# Patient Record
Sex: Male | Born: 1960 | Race: White | Hispanic: No | Marital: Married | Smoking: Former smoker
Health system: Southern US, Community
[De-identification: ages and names within clinical notes are randomized; demographics above are authoritative.]

## PROBLEM LIST (undated history)

## (undated) DIAGNOSIS — Z8249 Family history of ischemic heart disease and other diseases of the circulatory system: Secondary | ICD-10-CM

## (undated) DIAGNOSIS — K219 Gastro-esophageal reflux disease without esophagitis: Secondary | ICD-10-CM

## (undated) DIAGNOSIS — R002 Palpitations: Secondary | ICD-10-CM

## (undated) DIAGNOSIS — M1990 Unspecified osteoarthritis, unspecified site: Secondary | ICD-10-CM

## (undated) HISTORY — DX: Palpitations: R00.2

## (undated) HISTORY — DX: Family history of ischemic heart disease and other diseases of the circulatory system: Z82.49

---

## 2015-04-21 ENCOUNTER — Emergency Department (HOSPITAL_COMMUNITY): Payer: PRIVATE HEALTH INSURANCE

## 2015-04-21 ENCOUNTER — Encounter (HOSPITAL_COMMUNITY): Payer: Self-pay

## 2015-04-21 ENCOUNTER — Emergency Department (HOSPITAL_COMMUNITY)
Admission: EM | Admit: 2015-04-21 | Discharge: 2015-04-21 | Disposition: A | Discharge status: Home / Self Care | Payer: PRIVATE HEALTH INSURANCE | Attending: Emergency Medicine | Admitting: Emergency Medicine

## 2015-04-21 DIAGNOSIS — R0602 Shortness of breath: Secondary | ICD-10-CM | POA: Diagnosis not present

## 2015-04-21 DIAGNOSIS — Z7982 Long term (current) use of aspirin: Secondary | ICD-10-CM | POA: Insufficient documentation

## 2015-04-21 DIAGNOSIS — R079 Chest pain, unspecified: Secondary | ICD-10-CM

## 2015-04-21 DIAGNOSIS — Z79899 Other long term (current) drug therapy: Secondary | ICD-10-CM | POA: Insufficient documentation

## 2015-04-21 LAB — CBC WITH DIFFERENTIAL/PLATELET
BASOS PCT: 0 %
Basophils Absolute: 0 10*3/uL (ref 0.0–0.1)
EOS PCT: 1 %
Eosinophils Absolute: 0.1 10*3/uL (ref 0.0–0.7)
HCT: 44.6 % (ref 39.0–52.0)
HEMOGLOBIN: 14.9 g/dL (ref 13.0–17.0)
Lymphocytes Relative: 18 %
Lymphs Abs: 1.4 10*3/uL (ref 0.7–4.0)
MCH: 28.3 pg (ref 26.0–34.0)
MCHC: 33.4 g/dL (ref 30.0–36.0)
MCV: 84.6 fL (ref 78.0–100.0)
MONO ABS: 0.8 10*3/uL (ref 0.1–1.0)
MONOS PCT: 10 %
NEUTROS ABS: 5.4 10*3/uL (ref 1.7–7.7)
Neutrophils Relative %: 71 %
PLATELETS: 195 10*3/uL (ref 150–400)
RBC: 5.27 MIL/uL (ref 4.22–5.81)
RDW: 13.4 % (ref 11.5–15.5)
WBC: 7.7 10*3/uL (ref 4.0–10.5)

## 2015-04-21 LAB — BASIC METABOLIC PANEL
Anion gap: 7 (ref 5–15)
BUN: 11 mg/dL (ref 6–20)
CALCIUM: 8.9 mg/dL (ref 8.9–10.3)
CO2: 28 mmol/L (ref 22–32)
Chloride: 104 mmol/L (ref 101–111)
Creatinine, Ser: 0.98 mg/dL (ref 0.61–1.24)
GFR calc Af Amer: >60 mL/min (ref >60)
GLUCOSE: 96 mg/dL (ref 65–99)
Potassium: 4 mmol/L (ref 3.5–5.1)
Sodium: 139 mmol/L (ref 135–145)

## 2015-04-21 LAB — I-STAT TROPONIN, ED: TROPONIN I, POC: 0 ng/mL (ref 0.00–0.08)

## 2015-04-21 LAB — D-DIMER, QUANTITATIVE (NOT AT ARMC)

## 2015-04-21 MED ORDER — TRAMADOL HCL 50 MG PO TABS
50.0000 mg | ORAL_TABLET | Freq: Four times a day (QID) | ORAL | Status: DC | PRN
Start: 2015-04-21 — End: 2016-05-23

## 2015-04-21 MED ORDER — SODIUM CHLORIDE 0.9 % IV SOLN
INTRAVENOUS | Status: DC
  Administered 2015-04-21: 13:00:00 via INTRAVENOUS

## 2015-04-21 MED ORDER — ASPIRIN 81 MG PO CHEW
324.0000 mg | CHEWABLE_TABLET | Freq: Once | ORAL | Status: AC
  Administered 2015-04-21: 324 mg via ORAL
  Filled 2015-04-21: qty 4

## 2015-04-21 NOTE — ED Provider Notes (Signed)
CSN: [id number]     Arrival date & time 04/21/15  1020 History  By signing my name below, I, Marica Otter, attest that this documentation has been prepared under the direction and in the presence of Vanetta Mulders, MD. Electronically Signed: Marica Otter, ED Scribe. 04/21/2015. 11:32 AM.  Chief Complaint  Patient presents with  . Chest Pain    back   Patient is a 55 y.o. male presenting with chest pain. The history is provided by the patient. No language interpreter was used.  Chest Pain Associated symptoms: back pain and shortness of breath   Associated symptoms: no abdominal pain, no fever, no headache, no nausea and not vomiting    PCP: Remus Loffler, PA-C HPI Comments: Joel Snyder is a 55 y.o. male who presents to the Emergency Department complaining of atraumatic, aching, severe, waxing and waning, radiating back pain radiating to the chest onset early AM, 2 days ago. Modifying factors involve movement and aspirin. Pt specifies the pain is worse at night and it has been waking him up in the middle of the night for the past couple of days. Associated Sx include mild, intermittent SOB. Pt denies fever, chills, congestion, rhinorrhea, sore throat, cough, visual disturbances, abd pain, n/v/d, dysuria, hematuria, swelling of legs, headache, lightheadedness, or any new rashes. Pt further denies Hx of bleeding easily/blood thinner use.  History reviewed. No pertinent past medical history. History reviewed. No pertinent past surgical history. History reviewed. No pertinent family history. Social History  Substance Use Topics  . Smoking status: Never Smoker   . Smokeless tobacco: None  . Alcohol Use: No    Review of Systems  Constitutional: Negative for fever and chills.  HENT: Negative for congestion, rhinorrhea and sore throat.   Eyes: Negative for visual disturbance.  Respiratory: Positive for shortness of breath.   Cardiovascular: Positive for chest pain. Negative for leg  swelling.  Gastrointestinal: Negative for nausea, vomiting, abdominal pain and diarrhea.  Genitourinary: Negative for dysuria.  Musculoskeletal: Positive for back pain.  Skin: Negative for rash.  Neurological: Negative for headaches.  Hematological: Does not bruise/bleed easily.  Psychiatric/Behavioral: Negative for confusion.   Allergies  Review of patient's allergies indicates no known allergies.  Home Medications   Prior to Admission medications   Medication Sig Start Date End Date Taking? Authorizing Provider  aspirin 325 MG tablet Take 650 mg by mouth daily as needed for moderate pain.   Yes Historical Provider, MD  Multiple Vitamin (MULTIVITAMIN WITH MINERALS) TABS tablet Take 1 tablet by mouth daily.   Yes Historical Provider, MD  tetrahydrozoline-zinc (VISINE-AC) 0.05-0.25 % ophthalmic solution Place 2 drops into both eyes daily as needed (allergies).   Yes Historical Provider, MD  traMADol (ULTRAM) 50 MG tablet Take 1 tablet (50 mg total) by mouth every 6 (six) hours as needed. 04/21/15   Vanetta Mulders, MD   Triage Vitals: BP 133/94 mmHg  Pulse 80  Temp(Src) 98 F (36.7 C) (Oral)  Resp 18  Ht  (1.88 m)  Wt 198 lb (89.812 kg)  BMI 25.41 kg/m2  SpO2 99% Physical Exam  Constitutional: He is oriented to person, place, and time. He appears well-developed and well-nourished.  HENT:  Head: Normocephalic and atraumatic.  Eyes: EOM are normal. Pupils are equal, round, and reactive to light. No scleral icterus.  Eyes track normal   Neck: Normal range of motion.  Cardiovascular: Normal rate, regular rhythm, normal heart sounds and intact distal pulses.   Pulmonary/Chest: Effort normal and breath  sounds normal. No respiratory distress. He exhibits no tenderness.  Abdominal: Soft. Bowel sounds are normal. He exhibits no distension. There is no tenderness.  Musculoskeletal: Normal range of motion. He exhibits no edema (no swelling in ankles).  Neurological: He is alert and  oriented to person, place, and time.  Skin: Skin is warm and dry.  Psychiatric: He has a normal mood and affect. Judgment normal.  Nursing note and vitals reviewed.  ED Course  Procedures (including critical care time) DIAGNOSTIC STUDIES: Oxygen Saturation is 99% on ra, nl by my interpretation.    COORDINATION OF CARE: 11:23 AM: Discussed treatment plan which includes EKG with pt at bedside; patient verbalizes understanding and agrees with treatment plan.  Labs Review Labs Reviewed  D-DIMER, QUANTITATIVE (NOT AT [facility])  BASIC METABOLIC PANEL  CBC WITH DIFFERENTIAL/PLATELET  Rosezena SensorI-STAT TROPOININ, ED    Imaging Review Dg Chest 2 View  04/21/2015  CLINICAL DATA:  Severe aching, waxing and waning.  Back pain. EXAM: CHEST  2 VIEW COMPARISON:  None. FINDINGS: The heart size and mediastinal contours are within normal limits. Both lungs are clear. The visualized skeletal structures are unremarkable. IMPRESSION: No active cardiopulmonary disease. Electronically Signed   By: Elsie StainJohn T Curnes M.D.   On: 04/21/2015 13:13   I have personally reviewed and evaluated these images and lab results as part of my medical decision-making.   EKG Interpretation   Date/Time:  Wednesday April 21 2015 10:33:06 EDT Ventricular Rate:  76 PR Interval:  151 QRS Duration: 107 QT Interval:  362 QTC Calculation: 407 R Axis:   83 Text Interpretation:  Sinus rhythm No previous ECGs available Confirmed by  Dorothe Elmore  MD, [place] ([id number]) on 04/21/2015 10:49:06 AM      MDM   Final diagnoses:  Chest pain, unspecified chest pain type    Patient with extensive workup here for the chest pain that goes from the back to the front. No evidence of any pulmonary embolus based on d-dimer being negative. Pain is been persistent since early Monday morning troponin is negative not consistent with an acute cardiac event. Patient's labs otherwise without any significant abnormalities. Chest x-ray negative for pneumonia pneumothorax  or pulmonary edema. Patient will be started on a baby aspirin because he has strong family history of premature cardiac disease. Referral to cardiology and referral back to primary care doctor. Also tramadol for the pain. Work note provided for today. Patient stable and safe for discharge home.  I personally performed the services described in this documentation, which was scribed in my presence. The recorded information has been reviewed and is accurate.      Vanetta MuldersScott Jailene Cupit, MD 04/21/15 1332

## 2015-04-21 NOTE — ED Notes (Signed)
Complain of chest and back pain in between shoulder blade

## 2015-04-21 NOTE — Discharge Instructions (Signed)
I would recommend starting a baby aspirin a day. Today's workup negative for any evidence of a blood clot in the Long's or any acute cardiac event. Chest x-ray also negative for pneumonia pneumothorax or fluid on the lungs. Would recommend make an appointment follow-up with your regular doctor. Start a baby aspirin a day. Will give you a prescription for tramadol to take for the chest pain. Return for any new or worse symptoms. Referral also given to cardiology. Work note provided.

## 2015-04-29 ENCOUNTER — Encounter: Payer: Self-pay | Admitting: *Deleted

## 2015-05-03 ENCOUNTER — Ambulatory Visit (INDEPENDENT_AMBULATORY_CARE_PROVIDER_SITE_OTHER): Payer: PRIVATE HEALTH INSURANCE | Admitting: Cardiovascular Disease

## 2015-05-03 ENCOUNTER — Encounter: Payer: Self-pay | Admitting: Cardiovascular Disease

## 2015-05-03 VITALS — BP 116/79 | HR 71 | Ht 74.0 in | Wt 196.0 lb

## 2015-05-03 DIAGNOSIS — Z9289 Personal history of other medical treatment: Secondary | ICD-10-CM

## 2015-05-03 DIAGNOSIS — K219 Gastro-esophageal reflux disease without esophagitis: Secondary | ICD-10-CM | POA: Diagnosis not present

## 2015-05-03 DIAGNOSIS — Z87898 Personal history of other specified conditions: Secondary | ICD-10-CM | POA: Diagnosis not present

## 2015-05-03 DIAGNOSIS — R079 Chest pain, unspecified: Secondary | ICD-10-CM

## 2015-05-03 NOTE — Progress Notes (Signed)
Patient ID: Joel Snyder, male   DOB: 09/04/1960, 55 y.o.   MRN: [id number]       CARDIOLOGY CONSULT NOTE  Patient ID: Joel SilenceCharles Ostlund MRN: [id number] DOB/AGE: 09/04/1960 55 y.o.  Admit date: (Not on file) Primary Physician: Remus LofflerJones, Angel S, PA-C Referring Physician:   Reason for Consultation: chest pain  HPI: The patient is a 55 year old male with a history of GERD referred for chest pain. He says the pain is worse at night and starts in the mid back and radiates to the front. Lying down makes it worse. He was recently evaluated in the ED on 4/12 for this with an unremarkable workup. I personally reviewed all labs and studies. Basic metabolic panel, CBC, d-dimer, and troponin were all normal. ECG showed sinus rhythm with no ischemic ST segment or T-wave abnormalities. Chest x-ray was unremarkable.  His PCP prescribed a proton pump inhibitor and after 2 or 3 days, the patient's pain has completely dissipated. He denies exertional chest pain and shortness of breath. He also denies orthopnea, palpitations, leg swelling. He gets a lot of physical activity at work and walks a lot without any difficulties.  Fam: Brother had MI at 3735, was a smoker.  No Known Allergies  Current Outpatient Prescriptions  Medication Sig Dispense Refill  . aspirin 325 MG tablet Take 650 mg by mouth daily.     . Multiple Vitamin (MULTIVITAMIN WITH MINERALS) TABS tablet Take 1 tablet by mouth daily.    Marland Kitchen. omeprazole (PRILOSEC) 20 MG capsule Take 20 mg by mouth daily.    Marland Kitchen. tetrahydrozoline-zinc (VISINE-AC) 0.05-0.25 % ophthalmic solution Place 2 drops into both eyes daily as needed (allergies).    . traMADol (ULTRAM) 50 MG tablet Take 1 tablet (50 mg total) by mouth every 6 (six) hours as needed. 20 tablet 0   No current facility-administered medications for this visit.    Past Medical History  Diagnosis Date  . Palpitations   . Family history of heart disease     No past surgical history on  file.  Social History   Social History  . Marital Status: Married    Spouse Name: N/A  . Number of Children: N/A  . Years of Education: N/A   Occupational History  . Not on file.   Social History Main Topics  . Smoking status: Former Smoker -- 1.00 packs/day for 12 years    Types: Cigarettes    Start date: 10/06/1978    Quit date: 10/06/1990  . Smokeless tobacco: Never Used  . Alcohol Use: No  . Drug Use: No  . Sexual Activity: Not on file   Other Topics Concern  . Not on file   Social History Narrative       Prior to Admission medications   Medication Sig Start Date End Date Taking? Authorizing Provider  aspirin 325 MG tablet Take 650 mg by mouth daily as needed for moderate pain.    Historical Provider, MD  Multiple Vitamin (MULTIVITAMIN WITH MINERALS) TABS tablet Take 1 tablet by mouth daily.    Historical Provider, MD  omeprazole (PRILOSEC) 20 MG capsule Take 20 mg by mouth daily.    Historical Provider, MD  tetrahydrozoline-zinc (VISINE-AC) 0.05-0.25 % ophthalmic solution Place 2 drops into both eyes daily as needed (allergies).    Historical Provider, MD  traMADol (ULTRAM) 50 MG tablet Take 1 tablet (50 mg total) by mouth every 6 (six) hours as needed. 04/21/15   Vanetta MuldersScott Zackowski, MD     Review of systems  complete and found to be negative unless listed above in HPI     Physical exam Blood pressure 116/79, pulse 71, height  (1.88 m), weight 196 lb (88.905 kg). General: NAD Neck: No JVD, no thyromegaly or thyroid nodule.  Lungs: Clear to auscultation bilaterally with normal respiratory effort. CV: Nondisplaced PMI. Regular rate and rhythm, normal S1/S2, no S3/S4, no murmur.  No peripheral edema.  No carotid bruit.  Normal pedal pulses.  Abdomen: Soft, nontender, no hepatosplenomegaly, no distention.  Skin: Intact without lesions or rashes.  Neurologic: Alert and oriented x 3.  Psych: Normal affect. Extremities: No clubbing or cyanosis.  HEENT: Normal.    ECG: Most recent ECG reviewed.  Labs:   Lab Results  Component Value Date   WBC 7.7 04/21/2015   HGB 14.9 04/21/2015   HCT 44.6 04/21/2015   MCV 84.6 04/21/2015   PLT 195 04/21/2015   No results for input(s): NA, K, CL, CO2, BUN, CREATININE, CALCIUM, PROT, BILITOT, ALKPHOS, ALT, AST, GLUCOSE in the last 168 hours.  Invalid input(s): LABALBU No results found for: CKTOTAL, CKMB, CKMBINDEX, TROPONINI No results found for: CHOL No results found for: HDL No results found for: LDLCALC No results found for: TRIG No results found for: CHOLHDL No results found for: LDLDIRECT       Studies: No results found.  ASSESSMENT AND PLAN:  1. Chest pain: Atypical for a cardiac etiology. Alleviated with PPI. Clinical picture consistent with GERD. No further workup indicated.  2. GERD: Stable on omeprazole.  Dispo: fu prn.   Signed: Prentice Docker, M.D., F.A.C.C.  05/03/2015, 8:52 AM

## 2015-05-03 NOTE — Patient Instructions (Signed)
Continue all current medications.  Follow up AS NEEDED  

## 2016-05-23 ENCOUNTER — Ambulatory Visit (INDEPENDENT_AMBULATORY_CARE_PROVIDER_SITE_OTHER): Payer: 59

## 2016-05-23 ENCOUNTER — Ambulatory Visit (INDEPENDENT_AMBULATORY_CARE_PROVIDER_SITE_OTHER): Payer: 59 | Admitting: Physician Assistant

## 2016-05-23 ENCOUNTER — Encounter: Payer: Self-pay | Admitting: Physician Assistant

## 2016-05-23 VITALS — BP 128/85 | HR 86 | Temp 97.4°F | Ht 74.0 in | Wt 200.0 lb

## 2016-05-23 DIAGNOSIS — M25552 Pain in left hip: Secondary | ICD-10-CM

## 2016-05-23 DIAGNOSIS — K219 Gastro-esophageal reflux disease without esophagitis: Secondary | ICD-10-CM | POA: Diagnosis not present

## 2016-05-23 DIAGNOSIS — M1612 Unilateral primary osteoarthritis, left hip: Secondary | ICD-10-CM | POA: Diagnosis not present

## 2016-05-23 DIAGNOSIS — G8929 Other chronic pain: Secondary | ICD-10-CM

## 2016-05-23 MED ORDER — MELOXICAM 7.5 MG PO TABS: 7.5000 mg | ORAL_TABLET | Freq: Every day | ORAL | 0 refills | Status: DC

## 2016-05-23 MED ORDER — OMEPRAZOLE 20 MG PO CPDR: 20.0000 mg | DELAYED_RELEASE_CAPSULE | Freq: Every day | ORAL | 11 refills | Status: DC

## 2016-05-23 NOTE — Progress Notes (Signed)
BP 128/85   Pulse 86   Temp 97.4 F (36.3 C) (Oral)   Ht 6\' 2"  (1.88 m)   Wt 200 lb (90.7 kg)   BMI 25.68 kg/m    Subjective:    Patient ID: Joel Snyder, male    DOB: 1960-09-03, 56 y.o.   MRN: [id number]  HPI: Joel Snyder is a 56 y.o. male presenting on 05/23/2016 for Hip Pain (2 mos, left, yesterday slipped in wet grass)  Patient has had ongoing pain for the past 2 years in the left hip. It comes and goes. 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 The pain will occur at the front and lateral portion of the hip. He's taken some oral medication over-the-counter with minimal relief. Things seem to be getting a little bit better and yesterday he slipped on wet grass and fell and landed on the left hip. He is experiencing occasional leg numbness and weakness however does not report any motor changes or falling related to the weakness  Relevant past medical, surgical, family and social history reviewed and updated as indicated. Allergies and medications reviewed and updated.  Past Medical History:  Diagnosis Date  . Family history of heart disease   . Palpitations     History reviewed. No pertinent surgical history.  Review of Systems  Constitutional: Negative.  Negative for appetite change and fatigue.  HENT: Negative.   Eyes: Negative.  Negative for pain and visual disturbance.  Respiratory: Negative.  Negative for cough, chest tightness, shortness of breath and wheezing.   Cardiovascular: Negative.  Negative for chest pain, palpitations and leg swelling.  Gastrointestinal: Negative.  Negative for abdominal pain, diarrhea, nausea and vomiting.  Endocrine: Negative.   Genitourinary: Negative.   Musculoskeletal: Positive for arthralgias, gait problem and myalgias.  Skin: Negative.  Negative for color change and rash.  Neurological: Negative for weakness, numbness and headaches.  Psychiatric/Behavioral: Negative.     Allergies as of 05/23/2016   No Known Allergies       Medication List       Accurate as of 05/23/16  3:50 PM. Always use your most recent med list.          aspirin EC 81 MG tablet Take 81 mg by mouth daily.   meloxicam 7.5 MG tablet Commonly known as:  MOBIC Take 1 tablet (7.5 mg total) by mouth daily.   multivitamin with minerals Tabs tablet Take 1 tablet by mouth daily.   omeprazole 20 MG capsule Commonly known as:  PRILOSEC Take 1 capsule (20 mg total) by mouth daily.   tetrahydrozoline-zinc 0.05-0.25 % ophthalmic solution Commonly known as:  VISINE-AC Place 2 drops into both eyes daily as needed (allergies).          Objective:    BP 128/85   Pulse 86   Temp 97.4 F (36.3 C) (Oral)   Ht 6\' 2"  (1.88 m)   Wt 200 lb (90.7 kg)   BMI 25.68 kg/m   No Known Allergies  Physical Exam  Constitutional: He appears well-developed and well-nourished. No distress.  HENT:  Head: Normocephalic and atraumatic.  Eyes: Conjunctivae and EOM are normal. Pupils are equal, round, and reactive to light.  Cardiovascular: Normal rate, regular rhythm and normal heart sounds.   Pulmonary/Chest: Effort normal and breath sounds normal. No respiratory distress.  Musculoskeletal:       Left hip: He exhibits decreased range of motion and tenderness. He exhibits no swelling and no deformity.  Legs: Skin: Skin is warm and dry.  Psychiatric: He has a normal mood and affect. His behavior is normal.  Nursing note and vitals reviewed.       Assessment & Plan:   1. Chronic left hip pain - DG HIP UNILAT W OR W/O PELVIS 2-3 VIEWS LEFT; Future  2. Primary osteoarthritis of left hip - DG HIP UNILAT W OR W/O PELVIS 2-3 VIEWS LEFT; Future - meloxicam (MOBIC) 7.5 MG tablet; Take 1 tablet (7.5 mg total) by mouth daily.  Dispense: 30 tablet; Refill: 0  3. Gastroesophageal reflux disease without esophagitis - omeprazole (PRILOSEC) 20 MG capsule; Take 1 capsule (20 mg total) by mouth daily.  Dispense: 30 capsule; Refill: 11   Continue  all other maintenance medications as listed above.  Follow up plan: Return in about 1 year (around 05/23/2017).  Educational handout given for iliotibial band syndrome exercises  Remus LofflerAngel S. Keilin Gamboa PA-C [facility] [address]  [place], [place] [id number] [phone]   05/23/2016, 3:50 PM

## 2016-05-23 NOTE — Patient Instructions (Signed)
Iliotibial Band Syndrome Rehab  Ask your health care provider which exercises are safe for you. Do exercises exactly as told by your health care provider and adjust them as directed. It is normal to feel mild stretching, pulling, tightness, or discomfort as you do these exercises, but you should stop right away if you feel sudden pain or your pain gets worse. Do not begin these exercises until told by your health care provider.  Stretching and range of motion exercises  These exercises warm up your muscles and joints and improve the movement and flexibility of your hip and pelvis.  Exercise A: Quadriceps, prone     1. Lie on your abdomen on a firm surface, such as a bed or padded floor.  2. Bend your left / right knee and hold your ankle. If you cannot reach your ankle or pant leg, loop a belt around your foot and grab the belt instead.  3. Gently pull your heel toward your buttocks. Your knee should not slide out to the side. You should feel a stretch in the front of your thigh and knee.  4. Hold this position for __________ seconds.  Repeat __________ times. Complete this stretch __________ times a day.  Exercise B: Iliotibial band     1. Lie on your side with your left / right leg in the top position.  2. Bend both of your knees and grab your left / right ankle. Stretch out your bottom arm to help you balance.  3. Slowly bring your top knee back so your thigh goes behind your trunk.  4. Slowly lower your top leg toward the floor until you feel a gentle stretch on the outside of your left / right hip and thigh. If you do not feel a stretch and your knee will not fall farther, place the heel of your other foot on top of your knee and pull your knee down toward the floor with your foot.  5. Hold this position for __________ seconds.  Repeat __________ times. Complete this stretch __________ times a day.  Strengthening exercises  These exercises build strength and endurance in your hip and pelvis. Endurance is the  ability to use your muscles for a long time, even after they get tired.  Exercise C: Straight leg raises (   hip abductors)  1. Lie on your side with your left / right leg in the top position. Lie so your head, shoulder, knee, and hip line up. You may bend your bottom knee to help you balance.  2. Roll your hips slightly forward so your hips are stacked directly over each other and your left / right knee is facing forward.  3. Tense the muscles in your outer thigh and lift your top leg 4-6 inches (10-15 cm).  4. Hold this position for __________ seconds.  5. Slowly return to the starting position. Let your muscles relax completely before doing another repetition.  Repeat __________ times. Complete this exercise __________ times a day.  Exercise D: Straight leg raises (  hip extensors)  1. Lie on your abdomen on your bed or a firm surface. You can put a pillow under your hips if that is more comfortable.  2. Bend your left / right knee so your foot is straight up in the air.  3. Squeeze your buttock muscles and lift your left / right thigh off the bed. Do not let your back arch.  4. Tense this muscle as hard as you can without increasing any   knee pain.  5. Hold this position for __________ seconds.  6. Slowly lower your leg to the starting position and allow it to relax completely.  Repeat __________ times. Complete this exercise __________ times a day.  Exercise E: Hip hike  1. Stand sideways on a bottom step. Stand on your left / right leg with your other foot unsupported next to the step. You can hold onto the railing or wall if needed for balance.  2. Keep your knees straight and your torso square. Then, lift your left / right hip up toward the ceiling.  3. Slowly let your left / right hip lower toward the floor, past the starting position. Your foot should get closer to the floor. Do not lean or bend your knees.  Repeat __________ times. Complete this exercise __________ times a day.  This information is not  intended to replace advice given to you by your health care provider. Make sure you discuss any questions you have with your health care provider.  Document Released: 12/26/2004 Document Revised: 08/31/2015 Document Reviewed: 11/27/2014  Elsevier Interactive Patient Education © 2017 [employer]

## 2016-08-20 IMAGING — DX DG CHEST 2V
2 series · 2 of 2 positions shown · non-contrast
Comparison: None.

CLINICAL DATA: Severe aching, waxing and waning.  Back pain.

EXAM:
CHEST  2 VIEW

[chest pa]
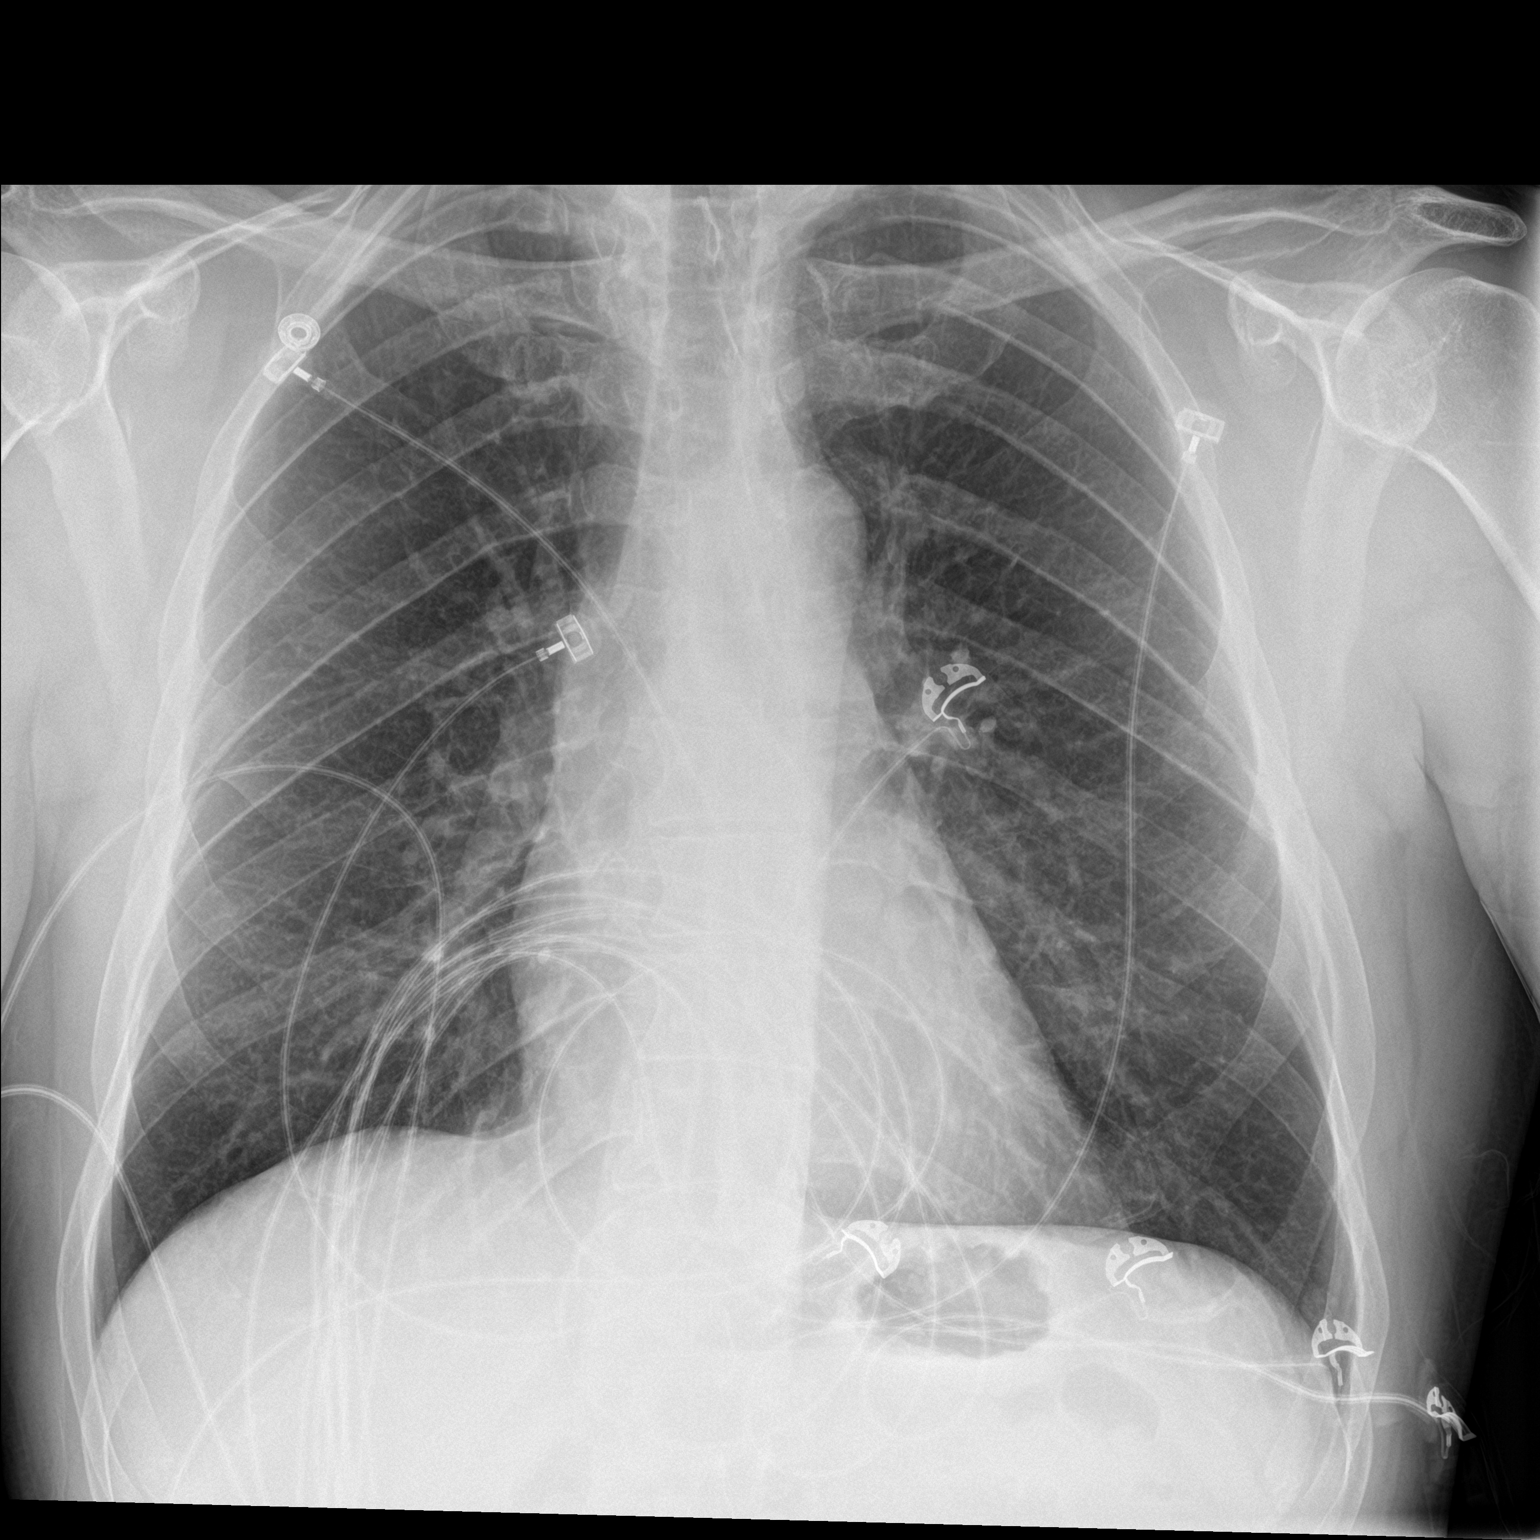

[chest lat]
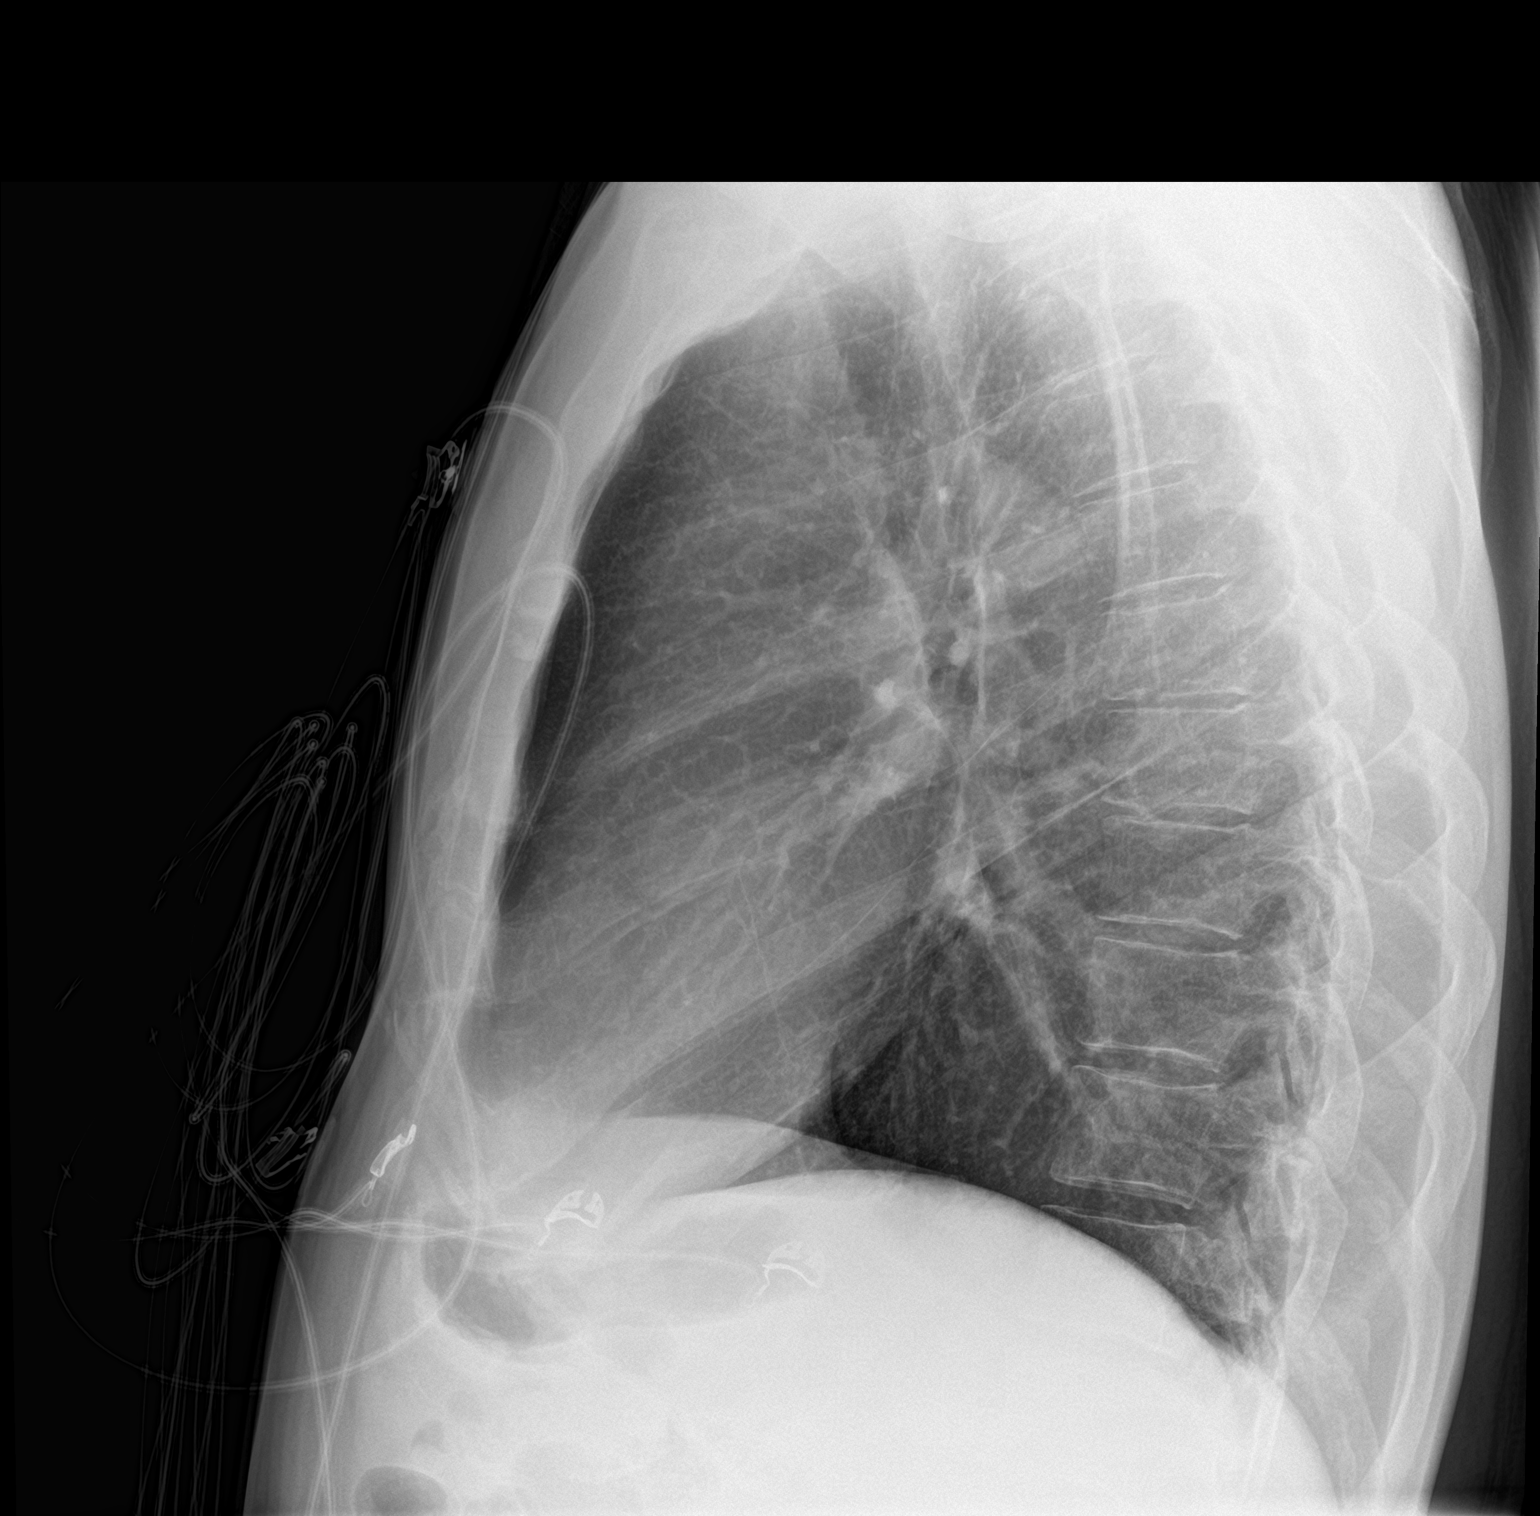

[2 of 2 positions shown; findings below may reference images not displayed]

FINDINGS: The heart size and mediastinal contours are within normal limits.
Both lungs are clear. The visualized skeletal structures are
unremarkable.
IMPRESSION: No active cardiopulmonary disease.

## 2017-01-16 ENCOUNTER — Encounter: Payer: Self-pay | Admitting: Physician Assistant

## 2017-01-16 ENCOUNTER — Ambulatory Visit: Payer: 59 | Admitting: Physician Assistant

## 2017-01-16 VITALS — BP 131/89 | HR 84 | Temp 98.3°F | Ht 74.0 in | Wt 199.8 lb

## 2017-01-16 DIAGNOSIS — M7551 Bursitis of right shoulder: Secondary | ICD-10-CM

## 2017-01-16 DIAGNOSIS — K219 Gastro-esophageal reflux disease without esophagitis: Secondary | ICD-10-CM | POA: Diagnosis not present

## 2017-01-16 MED ORDER — PREDNISONE 10 MG (21) PO TBPK: ORAL_TABLET | ORAL | 0 refills | Status: DC

## 2017-01-16 MED ORDER — ESOMEPRAZOLE MAGNESIUM 40 MG PO CPDR: 40.0000 mg | DELAYED_RELEASE_CAPSULE | Freq: Every day | ORAL | 11 refills | Status: DC

## 2017-01-16 NOTE — Patient Instructions (Signed)
In a few days you may receive a survey in the mail or online from [employer] regarding your visit with us today. Please take a moment to fill this out. Your feedback is very important to our whole office. It can help us better understand your needs as well as improve your experience and satisfaction. Thank you for taking your time to complete it. We care about you.  Denys Salinger, PA-C  Shoulder Exercises Ask your health care provider which exercises are safe for you. Do exercises exactly as told by your health care provider and adjust them as directed. It is normal to feel mild stretching, pulling, tightness, or discomfort as you do these exercises, but you should stop right away if you feel sudden pain or your pain gets worse.Do not begin these exercises until told by your health care provider. RANGE OF MOTION EXERCISES These exercises warm up your muscles and joints and improve the movement and flexibility of your shoulder. These exercises also help to relieve pain, numbness, and tingling. These exercises involve stretching your injured shoulder directly. Exercise A: Pendulum  1. Stand near a wall or a surface that you can hold onto for balance. 2. Bend at the waist and let your left / right arm hang straight down. Use your other arm to support you. Keep your back straight and do not lock your knees. 3. Relax your left / right arm and shoulder muscles, and move your hips and your trunk so your left / right arm swings freely. Your arm should swing because of the motion of your body, not because you are using your arm or shoulder muscles. 4. Keep moving your body so your arm swings in the following directions, as told by your health care provider: ? Side to side. ? Forward and backward. ? In clockwise and counterclockwise circles. 5. Continue each motion for __________ seconds, or for as long as told by your health care provider. 6. Slowly return to the starting position. Repeat __________ times.  Complete this exercise __________ times a day. Exercise B:Flexion, Standing  1. Stand and hold a broomstick, a cane, or a similar object. Place your hands a little more than shoulder-width apart on the object. Your left / right hand should be palm-up, and your other hand should be palm-down. 2. Keep your elbow straight and keep your shoulder muscles relaxed. Push the stick down with your healthy arm to raise your left / right arm in front of your body, and then over your head until you feel a stretch in your shoulder. ? Avoid shrugging your shoulder while you raise your arm. Keep your shoulder blade tucked down toward the middle of your back. 3. Hold for __________ seconds. 4. Slowly return to the starting position. Repeat __________ times. Complete this exercise __________ times a day. Exercise C: Abduction, Standing 1. Stand and hold a broomstick, a cane, or a similar object. Place your hands a little more than shoulder-width apart on the object. Your left / right hand should be palm-up, and your other hand should be palm-down. 2. While keeping your elbow straight and your shoulder muscles relaxed, push the stick across your body toward your left / right side. Raise your left / right arm to the side of your body and then over your head until you feel a stretch in your shoulder. ? Do not raise your arm above shoulder height, unless your health care provider tells you to do that. ? Avoid shrugging your shoulder while you raise   your arm. Keep your shoulder blade tucked down toward the middle of your back. 3. Hold for __________ seconds. 4. Slowly return to the starting position. Repeat __________ times. Complete this exercise __________ times a day. Exercise D:Internal Rotation  1. Place your left / right hand behind your back, palm-up. 2. Use your other hand to dangle an exercise band, a towel, or a similar object over your shoulder. Grasp the band with your left / right hand so you are holding  onto both ends. 3. Gently pull up on the band until you feel a stretch in the front of your left / right shoulder. ? Avoid shrugging your shoulder while you raise your arm. Keep your shoulder blade tucked down toward the middle of your back. 4. Hold for __________ seconds. 5. Release the stretch by letting go of the band and lowering your hands. Repeat __________ times. Complete this exercise __________ times a day. STRETCHING EXERCISES These exercises warm up your muscles and joints and improve the movement and flexibility of your shoulder. These exercises also help to relieve pain, numbness, and tingling. These exercises are done using your healthy shoulder to help stretch the muscles of your injured shoulder. Exercise E: Corner Stretch (External Rotation and Abduction)  1. Stand in a doorway with one of your feet slightly in front of the other. This is called a staggered stance. If you cannot reach your forearms to the door frame, stand facing a corner of a room. 2. Choose one of the following positions as told by your health care provider: ? Place your hands and forearms on the door frame above your head. ? Place your hands and forearms on the door frame at the height of your head. ? Place your hands on the door frame at the height of your elbows. 3. Slowly move your weight onto your front foot until you feel a stretch across your chest and in the front of your shoulders. Keep your head and chest upright and keep your abdominal muscles tight. 4. Hold for __________ seconds. 5. To release the stretch, shift your weight to your back foot. Repeat __________ times. Complete this stretch __________ times a day. Exercise F:Extension, Standing 1. Stand and hold a broomstick, a cane, or a similar object behind your back. ? Your hands should be a little wider than shoulder-width apart. ? Your palms should face away from your back. 2. Keeping your elbows straight and keeping your shoulder muscles  relaxed, move the stick away from your body until you feel a stretch in your shoulder. ? Avoid shrugging your shoulders while you move the stick. Keep your shoulder blade tucked down toward the middle of your back. 3. Hold for __________ seconds. 4. Slowly return to the starting position. Repeat __________ times. Complete this exercise __________ times a day. STRENGTHENING EXERCISES These exercises build strength and endurance in your shoulder. Endurance is the ability to use your muscles for a long time, even after they get tired. Exercise G:External Rotation  1. Sit in a stable chair without armrests. 2. Secure an exercise band at elbow height on your left / right side. 3. Place a soft object, such as a folded towel or a small pillow, between your left / right upper arm and your body to move your elbow a few inches away (about 10 cm) from your side. 4. Hold the end of the band so it is tight and there is no slack. 5. Keeping your elbow pressed against the soft object, move   your left / right forearm out, away from your abdomen. Keep your body steady so only your forearm moves. 6. Hold for __________ seconds. 7. Slowly return to the starting position. Repeat __________ times. Complete this exercise __________ times a day. Exercise H:Shoulder Abduction  1. Sit in a stable chair without armrests, or stand. 2. Hold a __________ weight in your left / right hand, or hold an exercise band with both hands. 3. Start with your arms straight down and your left / right palm facing in, toward your body. 4. Slowly lift your left / right hand out to your side. Do not lift your hand above shoulder height unless your health care provider tells you that this is safe. ? Keep your arms straight. ? Avoid shrugging your shoulder while you do this movement. Keep your shoulder blade tucked down toward the middle of your back. 5. Hold for __________ seconds. 6. Slowly lower your arm, and return to the starting  position. Repeat __________ times. Complete this exercise __________ times a day. Exercise I:Shoulder Extension 1. Sit in a stable chair without armrests, or stand. 2. Secure an exercise band to a stable object in front of you where it is at shoulder height. 3. Hold one end of the exercise band in each hand. Your palms should face each other. 4. Straighten your elbows and lift your hands up to shoulder height. 5. Step back, away from the secured end of the exercise band, until the band is tight and there is no slack. 6. Squeeze your shoulder blades together as you pull your hands down to the sides of your thighs. Stop when your hands are straight down by your sides. Do not let your hands go behind your body. 7. Hold for __________ seconds. 8. Slowly return to the starting position. Repeat __________ times. Complete this exercise __________ times a day. Exercise J:Standing Shoulder Row 1. Sit in a stable chair without armrests, or stand. 2. Secure an exercise band to a stable object in front of you so it is at waist height. 3. Hold one end of the exercise band in each hand. Your palms should be in a thumbs-up position. 4. Bend each of your elbows to an "L" shape (about 90 degrees) and keep your upper arms at your sides. 5. Step back until the band is tight and there is no slack. 6. Slowly pull your elbows back behind you. 7. Hold for __________ seconds. 8. Slowly return to the starting position. Repeat __________ times. Complete this exercise __________ times a day. Exercise K:Shoulder Press-Ups  1. Sit in a stable chair that has armrests. Sit upright, with your feet flat on the floor. 2. Put your hands on the armrests so your elbows are bent and your fingers are pointing forward. Your hands should be about even with the sides of your body. 3. Push down on the armrests and use your arms to lift yourself off of the chair. Straighten your elbows and lift yourself up as much as you  comfortably can. ? Move your shoulder blades down, and avoid letting your shoulders move up toward your ears. ? Keep your feet on the ground. As you get stronger, your feet should support less of your body weight as you lift yourself up. 4. Hold for __________ seconds. 5. Slowly lower yourself back into the chair. Repeat __________ times. Complete this exercise __________ times a day. Exercise L: Wall Push-Ups  1. Stand so you are facing a stable wall. Your feet should be   about one arm-length away from the wall. 2. Lean forward and place your palms on the wall at shoulder height. 3. Keep your feet flat on the floor as you bend your elbows and lean forward toward the wall. 4. Hold for __________ seconds. 5. Straighten your elbows to push yourself back to the starting position. Repeat __________ times. Complete this exercise __________ times a day. This information is not intended to replace advice given to you by your health care provider. Make sure you discuss any questions you have with your health care provider. Document Released: 11/09/2004 Document Revised: 09/20/2015 Document Reviewed: 09/06/2014 Elsevier Interactive Patient Education  2018 [employer] Inc[employer]  

## 2017-01-16 NOTE — Progress Notes (Signed)
BP 131/89   Pulse 84   Temp 98.3 F (36.8 C) (Oral)   Ht 6\' 2"  (1.88 m)   Wt 199 lb 12.8 oz (90.6 kg)   BMI 25.65 kg/m    Subjective:    Patient ID: Joel Snyder, male    DOB: 08-May-1960, 57 y.o.   MRN: [id number]  HPI: Joel Snyder is a 57 y.o. male presenting on 01/16/2017 for Shoulder Pain (right ) and Gastroesophageal Reflux  Has right shoulder pain in the front area.  Pain whenever he raises his arm forward.  He has worked as a [employer] for many years.  He will have a lot of pain when he leans onto his elbow and it puts pressure up into the shoulder.  He states he has had some clicking and popping when he moves it.  He is also had a increase in his reflux.  He has been taking omeprazole 20 mg regularly.  However he is having breakthrough episodes.  We are going to change his medications today and he will let us know if it does not improve.  Relevant past medical, surgical, family and social history reviewed and updated as indicated. Allergies and medications reviewed and updated.  Past Medical History:  Diagnosis Date  . Family history of heart disease   . Palpitations     History reviewed. No pertinent surgical history.  Review of Systems  Constitutional: Negative.  Negative for appetite change and fatigue.  HENT: Negative.   Eyes: Negative.  Negative for pain and visual disturbance.  Respiratory: Negative.  Negative for cough, chest tightness, shortness of breath and wheezing.   Cardiovascular: Negative.  Negative for chest pain, palpitations and leg swelling.  Gastrointestinal: Negative.  Negative for abdominal pain, diarrhea, nausea and vomiting.  Endocrine: Negative.   Genitourinary: Negative.   Musculoskeletal: Negative.   Skin: Negative.  Negative for color change and rash.  Neurological: Negative.  Negative for weakness, numbness and headaches.  Psychiatric/Behavioral: Negative.     Allergies as of 01/16/2017   No Known Allergies     Medication List        Accurate as of 01/16/17  2:11 PM. Always use your most recent med list.          aspirin EC 81 MG tablet Take 81 mg by mouth daily.   esomeprazole 40 MG capsule Commonly known as:  NEXIUM Take 1 capsule (40 mg total) by mouth daily.   multivitamin with minerals Tabs tablet Take 1 tablet by mouth daily.   predniSONE 10 MG (21) Tbpk tablet Commonly known as:  STERAPRED UNI-PAK 21 TAB As directed x 6 days   tetrahydrozoline-zinc 0.05-0.25 % ophthalmic solution Commonly known as:  VISINE-AC Place 2 drops into both eyes daily as needed (allergies).          Objective:    BP 131/89   Pulse 84   Temp 98.3 F (36.8 C) (Oral)   Ht 6\' 2"  (1.88 m)   Wt 199 lb 12.8 oz (90.6 kg)   BMI 25.65 kg/m   No Known Allergies  Physical Exam  Constitutional: He appears well-developed and well-nourished. No distress.  HENT:  Head: Normocephalic and atraumatic.  Eyes: Conjunctivae and EOM are normal. Pupils are equal, round, and reactive to light.  Cardiovascular: Normal rate, regular rhythm and normal heart sounds.  Pulmonary/Chest: Effort normal and breath sounds normal. No respiratory distress.  Musculoskeletal:       Right shoulder: He exhibits decreased range of motion and tenderness.  He exhibits no deformity.  Skin: Skin is warm and dry.  Psychiatric: He has a normal mood and affect. His behavior is normal.  Nursing note and vitals reviewed.       Assessment & Plan:   1. Bursitis of right shoulder - predniSONE (STERAPRED UNI-PAK 21 TAB) 10 MG (21) TBPK tablet; As directed x 6 days  Dispense: 21 tablet; Refill: 0  2. Gastroesophageal reflux disease without esophagitis - esomeprazole (NEXIUM) 40 MG capsule; Take 1 capsule (40 mg total) by mouth daily.  Dispense: 30 capsule; Refill: 11     Current Outpatient Medications:  .  aspirin EC 81 MG tablet, Take 81 mg by mouth daily., Disp: , Rfl:  .  Multiple Vitamin (MULTIVITAMIN WITH MINERALS) TABS tablet, Take 1 tablet  by mouth daily., Disp: , Rfl:  .  tetrahydrozoline-zinc (VISINE-AC) 0.05-0.25 % ophthalmic solution, Place 2 drops into both eyes daily as needed (allergies)., Disp: , Rfl:  .  esomeprazole (NEXIUM) 40 MG capsule, Take 1 capsule (40 mg total) by mouth daily., Disp: 30 capsule, Rfl: 11 .  predniSONE (STERAPRED UNI-PAK 21 TAB) 10 MG (21) TBPK tablet, As directed x 6 days, Disp: 21 tablet, Rfl: 0 Continue all other maintenance medications as listed above.  Follow up plan: Return if symptoms worsen or fail to improve.  Educational handout given for survey  Remus Loffler PA-C [facility] [address]  [place], [place] [address] [phone]   01/16/2017, 2:11 PM

## 2017-01-30 ENCOUNTER — Ambulatory Visit: Payer: 59 | Admitting: Physician Assistant

## 2017-01-30 ENCOUNTER — Encounter: Payer: Self-pay | Admitting: Physician Assistant

## 2017-01-30 VITALS — BP 130/85 | HR 94 | Temp 97.2°F | Ht 74.0 in | Wt 199.6 lb

## 2017-01-30 DIAGNOSIS — Z0000 Encounter for general adult medical examination without abnormal findings: Secondary | ICD-10-CM | POA: Diagnosis not present

## 2017-01-30 NOTE — Patient Instructions (Signed)

## 2017-01-30 NOTE — Progress Notes (Signed)
BP 130/85   Pulse 94   Temp (!) 97.2 F (36.2 C) (Oral)   Ht 6' 2"  (1.88 m)   Wt 199 lb 9.6 oz (90.5 kg)   BMI 25.63 kg/m    Subjective:    Patient ID: Joel Snyder, male    DOB: 1960-05-10, 57 y.o.   MRN: [id number]  HPI: [name] is a 57 y.o. male presenting on 01/30/2017 for Annual Exam  This patient comes in for annual well physical examination. All medications are reviewed today. There are no reports of any problems with the medications. All of the medical conditions are reviewed and updated.  Lab work is reviewed and will be ordered as medically necessary. There are no new problems reported with today's visit.  Patient reports doing well overall.   Relevant past medical, surgical, family and social history reviewed and updated as indicated. Allergies and medications reviewed and updated.  Past Medical History:  Diagnosis Date  . Family history of heart disease   . Palpitations     History reviewed. No pertinent surgical history.  Review of Systems  Constitutional: Negative.  Negative for appetite change and fatigue.  HENT: Negative.   Eyes: Negative.  Negative for pain and visual disturbance.  Respiratory: Negative.  Negative for cough, chest tightness, shortness of breath and wheezing.   Cardiovascular: Negative.  Negative for chest pain, palpitations and leg swelling.  Gastrointestinal: Negative.  Negative for abdominal distention, abdominal pain, constipation, diarrhea, nausea and vomiting.  Endocrine: Negative.   Genitourinary: Negative.   Musculoskeletal: Positive for arthralgias.  Skin: Negative.  Negative for color change and rash.  Neurological: Negative.  Negative for weakness, numbness and headaches.  Psychiatric/Behavioral: Negative.     Allergies as of 01/30/2017   No Known Allergies     Medication List        Accurate as of 01/30/17 12:34 PM. Always use your most recent med list.          aspirin EC 81 MG tablet Take 81 mg by mouth  daily.   esomeprazole 40 MG capsule Commonly known as:  NEXIUM Take 1 capsule (40 mg total) by mouth daily.   multivitamin with minerals Tabs tablet Take 1 tablet by mouth daily.   tetrahydrozoline-zinc 0.05-0.25 % ophthalmic solution Commonly known as:  VISINE-AC Place 2 drops into both eyes daily as needed (allergies).          Objective:    BP 130/85   Pulse 94   Temp (!) 97.2 F (36.2 C) (Oral)   Ht 6' 2"  (1.88 m)   Wt 199 lb 9.6 oz (90.5 kg)   BMI 25.63 kg/m   No Known Allergies  Physical Exam  Constitutional: He appears well-developed and well-nourished.  HENT:  Head: Normocephalic and atraumatic.  Eyes: Conjunctivae and EOM are normal. Pupils are equal, round, and reactive to light.  Neck: Normal range of motion. Neck supple.  Cardiovascular: Normal rate, regular rhythm and normal heart sounds.  Pulmonary/Chest: Effort normal and breath sounds normal.  Abdominal: Soft. Bowel sounds are normal.  Musculoskeletal: Normal range of motion.  Skin: Skin is warm and dry.  Nursing note and vitals reviewed.       Assessment & Plan:   1. Well adult exam - CBC with Differential/Platelet - CMP14+EGFR - Lipid panel - PSA - TSH - Ambulatory referral to Gastroenterology    Current Outpatient Medications:  .  aspirin EC 81 MG tablet, Take 81 mg by mouth daily., Disp: , Rfl:  .  esomeprazole (NEXIUM) 40 MG capsule, Take 1 capsule (40 mg total) by mouth daily., Disp: 30 capsule, Rfl: 11 .  Multiple Vitamin (MULTIVITAMIN WITH MINERALS) TABS tablet, Take 1 tablet by mouth daily., Disp: , Rfl:  .  tetrahydrozoline-zinc (VISINE-AC) 0.05-0.25 % ophthalmic solution, Place 2 drops into both eyes daily as needed (allergies)., Disp: , Rfl:  Continue all other maintenance medications as listed above.  Follow up plan: Return in about 1 year (around 01/30/2018).  Educational handout given for health maintenance  Terald Sleeper PA-C [facility] [address]  [place],  [address] [phone]   01/30/2017, 12:34 PM

## 2017-01-31 LAB — CMP14+EGFR
A/G RATIO: 1.9 (ref 1.2–2.2)
ALK PHOS: 55 IU/L (ref 39–117)
ALT: 38 IU/L (ref 0–44)
AST: 27 IU/L (ref 0–40)
Albumin: 4.5 g/dL (ref 3.5–5.5)
BILIRUBIN TOTAL: 0.5 mg/dL (ref 0.0–1.2)
BUN/Creatinine Ratio: 10 (ref 9–20)
BUN: 10 mg/dL (ref 6–24)
CO2: 23 mmol/L (ref 20–29)
Calcium: 9.3 mg/dL (ref 8.7–10.2)
Chloride: 102 mmol/L (ref 96–106)
Creatinine, Ser: 0.97 mg/dL (ref 0.76–1.27)
GFR calc non Af Amer: 88 mL/min/{1.73_m2} (ref >59)
GFR, EST AFRICAN AMERICAN: 102 mL/min/{1.73_m2} (ref >59)
GLUCOSE: 104 mg/dL — AB (ref 65–99)
Globulin, Total: 2.4 g/dL (ref 1.5–4.5)
POTASSIUM: 4.2 mmol/L (ref 3.5–5.2)
Sodium: 141 mmol/L (ref 134–144)
TOTAL PROTEIN: 6.9 g/dL (ref 6.0–8.5)

## 2017-01-31 LAB — CBC WITH DIFFERENTIAL/PLATELET
BASOS ABS: 0 10*3/uL (ref 0.0–0.2)
BASOS: 0 %
EOS (ABSOLUTE): 0.1 10*3/uL (ref 0.0–0.4)
Eos: 2 %
Hematocrit: 42.8 % (ref 37.5–51.0)
Hemoglobin: 14.5 g/dL (ref 13.0–17.7)
Immature Grans (Abs): 0 10*3/uL (ref 0.0–0.1)
Immature Granulocytes: 0 %
LYMPHS ABS: 1.9 10*3/uL (ref 0.7–3.1)
Lymphs: 26 %
MCH: 28.4 pg (ref 26.6–33.0)
MCHC: 33.9 g/dL (ref 31.5–35.7)
MCV: 84 fL (ref 79–97)
Monocytes Absolute: 0.7 10*3/uL (ref 0.1–0.9)
Monocytes: 10 %
NEUTROS ABS: 4.4 10*3/uL (ref 1.4–7.0)
Neutrophils: 62 %
PLATELETS: 197 10*3/uL (ref 150–379)
RBC: 5.11 x10E6/uL (ref 4.14–5.80)
RDW: 14.1 % (ref 12.3–15.4)
WBC: 7.2 10*3/uL (ref 3.4–10.8)

## 2017-01-31 LAB — LIPID PANEL
CHOLESTEROL TOTAL: 196 mg/dL (ref 100–199)
Chol/HDL Ratio: 3.1 ratio (ref 0.0–5.0)
HDL: 63 mg/dL (ref >39)
LDL Calculated: 115 mg/dL — ABNORMAL HIGH (ref 0–99)
Triglycerides: 91 mg/dL (ref 0–149)
VLDL Cholesterol Cal: 18 mg/dL (ref 5–40)

## 2017-01-31 LAB — PSA: PROSTATE SPECIFIC AG, SERUM: 1.6 ng/mL (ref 0.0–4.0)

## 2017-01-31 LAB — TSH: TSH: 2.03 u[IU]/mL (ref 0.450–4.500)

## 2017-02-27 ENCOUNTER — Ambulatory Visit: Payer: 59 | Admitting: Nurse Practitioner

## 2017-02-27 ENCOUNTER — Encounter: Payer: Self-pay | Admitting: Nurse Practitioner

## 2017-02-27 VITALS — BP 127/79 | HR 87 | Temp 97.8°F | Ht 74.0 in | Wt 202.0 lb

## 2017-02-27 DIAGNOSIS — J0100 Acute maxillary sinusitis, unspecified: Secondary | ICD-10-CM

## 2017-02-27 MED ORDER — AMOXICILLIN-POT CLAVULANATE 875-125 MG PO TABS: 1.0000 | ORAL_TABLET | Freq: Two times a day (BID) | ORAL | 0 refills | Status: DC

## 2017-02-27 NOTE — Progress Notes (Signed)
   Subjective:    Patient ID: Joel Snyder, male    DOB: September 20, 1960, 57 y.o.   MRN: [id number]  HPI  Patient comes in today c/o sinus congestion, runny  Nose. Has had low grade fever at night. Denies cough. Started 3 days ago. Has taken mucinex which has not helped.   Review of Systems  Constitutional: Positive for fatigue and fever (low grade). Negative for chills.  HENT: Positive for congestion, postnasal drip, rhinorrhea, sinus pressure and sinus pain. Negative for sore throat and trouble swallowing.   Respiratory: Negative for cough and shortness of breath.   Cardiovascular: Negative.   Genitourinary: Negative.   Neurological: Positive for headaches.  Psychiatric/Behavioral: Negative.   All other systems reviewed and are negative.      Objective:   Physical Exam  Constitutional: He appears well-developed and well-nourished. He appears distressed (mild).  HENT:  Right Ear: Hearing, tympanic membrane, external ear and ear canal normal.  Left Ear: Hearing, tympanic membrane, external ear and ear canal normal.  Nose: Mucosal edema and rhinorrhea present. Right sinus exhibits maxillary sinus tenderness. Right sinus exhibits no frontal sinus tenderness. Left sinus exhibits maxillary sinus tenderness. Left sinus exhibits no frontal sinus tenderness.  Mouth/Throat: Uvula is midline, oropharynx is clear and moist and mucous membranes are normal.  Cardiovascular: Normal rate and regular rhythm.  Pulmonary/Chest: Effort normal and breath sounds normal. No respiratory distress. He has no wheezes.  Breathing through  mouth  Neurological: He has normal reflexes. No cranial nerve deficit.  Skin: Skin is warm.  Psychiatric: He has a normal mood and affect. His behavior is normal. Judgment and thought content normal.  BP 127/79   Pulse 87   Temp 97.8 F (36.6 C) (Oral)   Ht 6\' 2"  (1.88 m)   Wt 202 lb (91.6 kg)   BMI 25.94 kg/m       Assessment & Plan:  1. Acute maxillary sinusitis,  recurrence not specified 1. Take meds as prescribed 2. Use a cool mist humidifier especially during the winter months and when heat has been humid. 3. Use saline nose sprays frequently 4. Saline irrigations of the nose can be very helpful if done frequently.  * 4X daily for 1 week*  * Use of a nettie pot can be helpful with this. Follow directions with this* 5. Drink plenty of fluids 6. Keep thermostat turn down low 7.For any cough or congestion  Use plain Mucinex- regular strength or max strength is fine   * Children- consult with Pharmacist for dosing 8. For fever or aces or pains- take tylenol or ibuprofen appropriate for age and weight.  * for fevers greater than 101 orally you may alternate ibuprofen and tylenol every  3 hours.    - amoxicillin-clavulanate (AUGMENTIN) 875-125 MG tablet; Take 1 tablet by mouth 2 (two) times daily.  Dispense: 14 tablet; Refill: 0  Mary-Margaret Daphine DeutscherMartin, FNP

## 2017-02-27 NOTE — Patient Instructions (Signed)

## 2017-09-22 IMAGING — DX DG HIP (WITH OR WITHOUT PELVIS) 2-3V*L*
3 series · 3 of 3 positions shown · non-contrast
Comparison: None.

CLINICAL DATA: Left hip pain chronically.  No injury 05/23/2016.

EXAM:
DG HIP (WITH OR WITHOUT PELVIS) 2-3V LEFT

[hip ap]
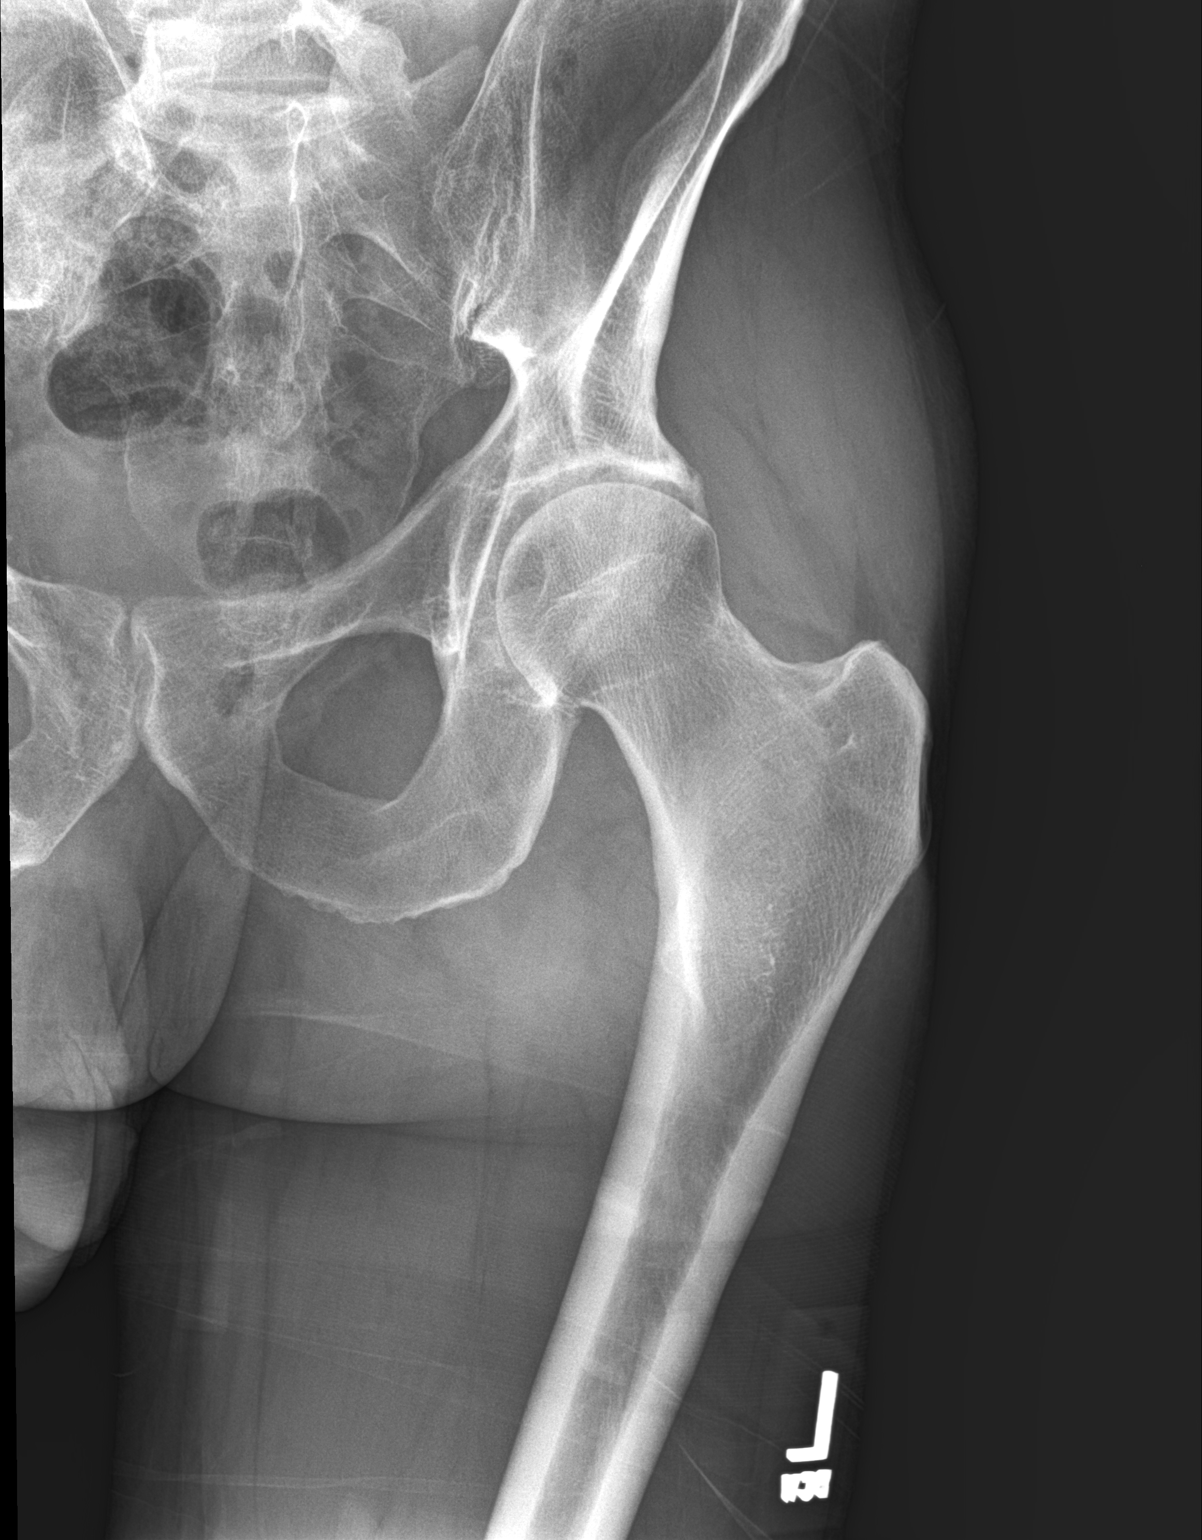

[hip lat]
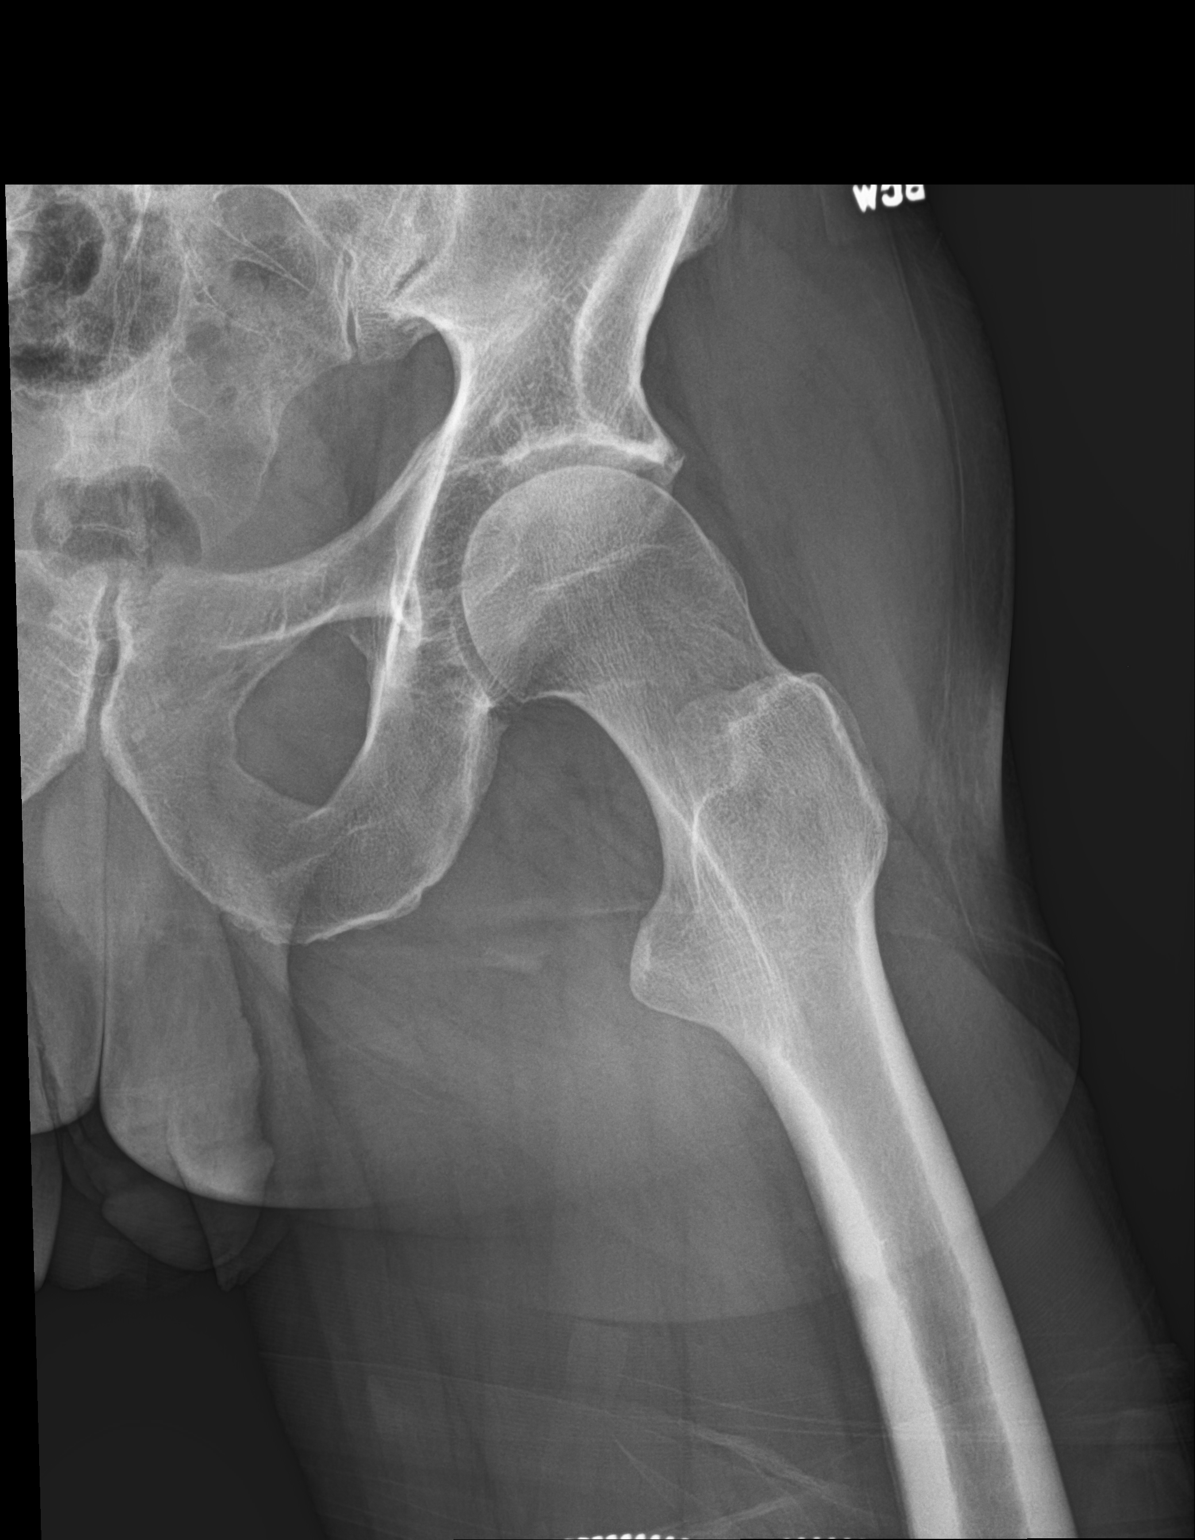

[pelvis ap]
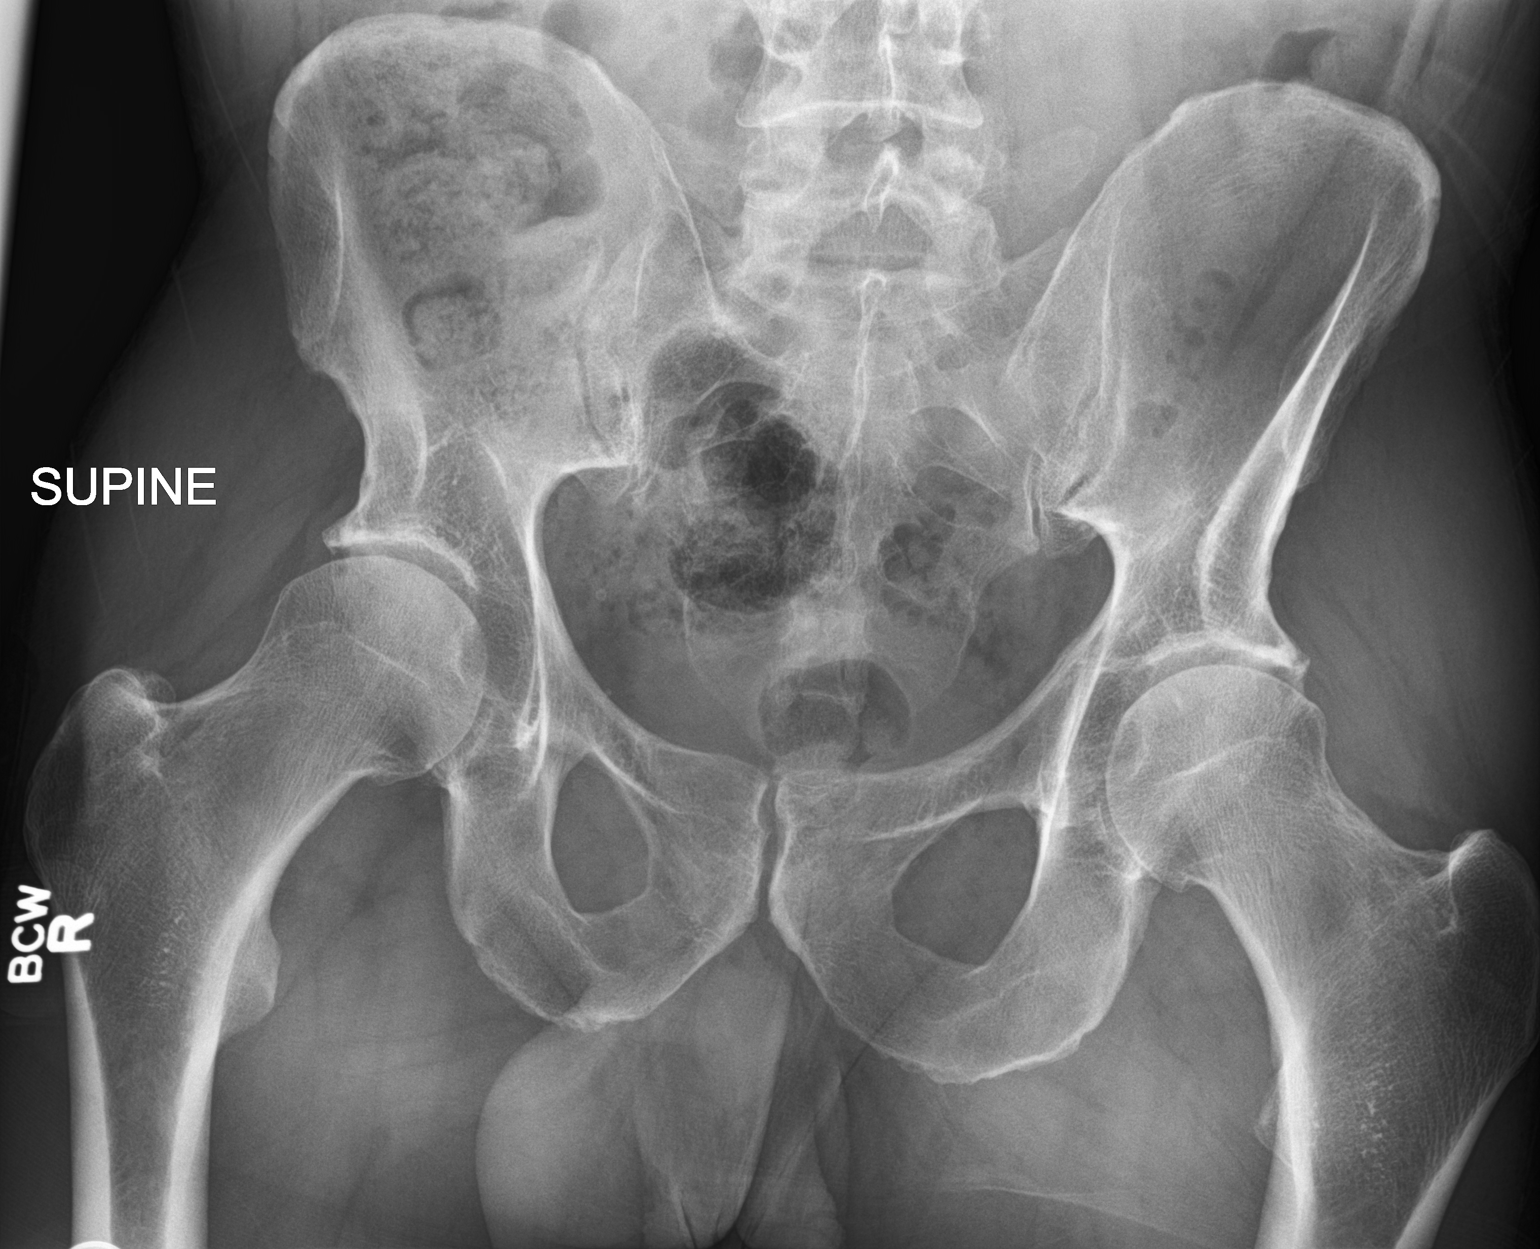

[3 of 3 positions shown; findings below may reference images not displayed]

FINDINGS: Mild symmetric degenerative change of the hips. No acute fracture or
dislocation.
IMPRESSION: No acute findings.

Mild symmetric degenerative change of the hips.

## 2017-10-12 ENCOUNTER — Ambulatory Visit (INDEPENDENT_AMBULATORY_CARE_PROVIDER_SITE_OTHER): Payer: 59

## 2017-10-12 ENCOUNTER — Encounter: Payer: Self-pay | Admitting: Physician Assistant

## 2017-10-12 ENCOUNTER — Ambulatory Visit: Payer: 59 | Admitting: Physician Assistant

## 2017-10-12 VITALS — BP 122/86 | HR 62 | Temp 97.2°F | Ht 74.0 in | Wt 203.0 lb

## 2017-10-12 DIAGNOSIS — M79672 Pain in left foot: Secondary | ICD-10-CM | POA: Diagnosis not present

## 2017-10-12 DIAGNOSIS — G8929 Other chronic pain: Secondary | ICD-10-CM

## 2017-10-12 MED ORDER — PREDNISONE 10 MG (21) PO TBPK: ORAL_TABLET | ORAL | 0 refills | Status: DC

## 2017-10-14 NOTE — Progress Notes (Signed)
BP 122/86 (BP Location: Left Arm)   Pulse 62   Temp (!) 97.2 F (36.2 C) (Oral)   Ht 6\' 2"  (1.88 m)   Wt 203 lb (92.1 kg)   BMI 26.06 kg/m    Subjective:    Patient ID: Joel Snyder, male    DOB: March 01, 1960, 57 y.o.   MRN: [id number]  HPI: Joel Snyder is a 57 y.o. male presenting on 10/12/2017 for left foot pain  Patient comes in for a left foot that is been bothering him.  It hurts throughout the day whenever he stands.  Primarily hurts between the fourth and fifth metatarsal.  There is no deformity.  There is no break in the skin was.  He has not had any injury or that he remembers.  Past Medical History:  Diagnosis Date  . Family history of heart disease   . Palpitations    Relevant past medical, surgical, family and social history reviewed and updated as indicated. Interim medical history since our last visit reviewed. Allergies and medications reviewed and updated. DATA REVIEWED: CHART IN EPIC  Family History reviewed for pertinent findings.  Review of Systems  Constitutional: Negative.  Negative for appetite change and fatigue.  Eyes: Negative for pain and visual disturbance.  Respiratory: Negative.  Negative for cough, chest tightness, shortness of breath and wheezing.   Cardiovascular: Negative.  Negative for chest pain, palpitations and leg swelling.  Gastrointestinal: Negative.  Negative for abdominal pain, diarrhea, nausea and vomiting.  Genitourinary: Negative.   Musculoskeletal: Positive for arthralgias, gait problem and myalgias.  Skin: Negative.  Negative for color change and rash.  Neurological: Negative for weakness, numbness and headaches.  Psychiatric/Behavioral: Negative.     Allergies as of 10/12/2017   No Known Allergies     Medication List        Accurate as of 10/12/17 11:59 PM. Always use your most recent med list.          aspirin EC 81 MG tablet Take 81 mg by mouth daily.   esomeprazole 40 MG capsule Commonly known as:   NEXIUM Take 1 capsule (40 mg total) by mouth daily.   multivitamin with minerals Tabs tablet Take 1 tablet by mouth daily.   predniSONE 10 MG (21) Tbpk tablet Commonly known as:  STERAPRED UNI-PAK 21 TAB As directed x 6 days   tetrahydrozoline-zinc 0.05-0.25 % ophthalmic solution Commonly known as:  VISINE-AC Place 2 drops into both eyes daily as needed (allergies).          Objective:    BP 122/86 (BP Location: Left Arm)   Pulse 62   Temp (!) 97.2 F (36.2 C) (Oral)   Ht 6\' 2"  (1.88 m)   Wt 203 lb (92.1 kg)   BMI 26.06 kg/m   No Known Allergies  Wt Readings from Last 3 Encounters:  10/12/17 203 lb (92.1 kg)  02/27/17 202 lb (91.6 kg)  01/30/17 199 lb 9.6 oz (90.5 kg)    Physical Exam  Constitutional: He appears well-developed and well-nourished. No distress.  HENT:  Head: Normocephalic and atraumatic.  Eyes: Pupils are equal, round, and reactive to light. Conjunctivae and EOM are normal.  Cardiovascular: Normal rate, regular rhythm and normal heart sounds.  Pulmonary/Chest: Effort normal and breath sounds normal. No respiratory distress.  Musculoskeletal:       Left foot: There is tenderness. There is no swelling and no deformity.       Feet:  Skin: Skin is warm and  dry.  Psychiatric: He has a normal mood and affect. His behavior is normal.  Nursing note and vitals reviewed.       Assessment & Plan:   1. Chronic pain in left foot - DG Foot Complete Left; Future   Continue all other maintenance medications as listed above.  Follow up plan: No follow-ups on file.  Educational handout given for survey  Remus Loffler PA-C [facility] [address]  [place], [place] [address] [phone]   10/14/2017, 9:28 PM

## 2017-10-19 ENCOUNTER — Ambulatory Visit: Payer: 59 | Admitting: Physician Assistant

## 2017-11-07 ENCOUNTER — Encounter: Payer: Self-pay | Admitting: Physician Assistant

## 2017-11-07 ENCOUNTER — Ambulatory Visit: Payer: 59 | Admitting: Physician Assistant

## 2017-11-07 VITALS — BP 118/81 | HR 99 | Temp 97.5°F | Ht 74.0 in | Wt 204.6 lb

## 2017-11-07 DIAGNOSIS — M79672 Pain in left foot: Secondary | ICD-10-CM

## 2017-11-07 DIAGNOSIS — G8929 Other chronic pain: Secondary | ICD-10-CM | POA: Diagnosis not present

## 2017-11-07 DIAGNOSIS — M25552 Pain in left hip: Secondary | ICD-10-CM

## 2017-11-07 MED ORDER — PREDNISONE 10 MG (21) PO TBPK: ORAL_TABLET | ORAL | 1 refills | Status: DC

## 2017-11-11 NOTE — Progress Notes (Signed)
BP 118/81   Pulse 99   Temp (!) 97.5 F (36.4 C) (Oral)   Ht _0  (1.88 m)   Wt 204 lb 9.6 oz (92.8 kg)   BMI 26.27 kg/m    Subjective:    Patient ID: Joel Snyder, male    DOB: 1960-03-08, 57 y.o.   MRN: [id number]  HPI: Joel Snyder is a 57 y.o. male presenting on 11/07/2017 for right hip and top of right foot pain  He has continued pain in the right hip and knee. He has worked Designer, industrial/product hard over the years. There has been no specific injury. He has soreness and pain, made worse by activity and going from sitting to standing. AM stiffness is less than 30 minutes  Past Medical History:  Diagnosis Date  . Family history of heart disease   . Palpitations    Relevant past medical, surgical, family and social history reviewed and updated as indicated. Interim medical history since our last visit reviewed. Allergies and medications reviewed and updated. DATA REVIEWED: CHART IN EPIC  Family History reviewed for pertinent findings.  Review of Systems  Constitutional: Negative.  Negative for appetite change and fatigue.  Eyes: Negative for pain and visual disturbance.  Respiratory: Negative.  Negative for cough, chest tightness, shortness of breath and wheezing.   Cardiovascular: Negative.  Negative for chest pain, palpitations and leg swelling.  Gastrointestinal: Negative.  Negative for abdominal pain, diarrhea, nausea and vomiting.  Genitourinary: Negative.   Musculoskeletal: Positive for arthralgias, back pain, joint swelling and myalgias.  Skin: Negative.  Negative for color change and rash.  Neurological: Negative.  Negative for weakness, numbness and headaches.  Psychiatric/Behavioral: Negative.     Allergies as of 11/07/2017   No Known Allergies     Medication List        Accurate as of 11/07/17 11:59 PM. Always use your most recent med list.          aspirin EC 81 MG tablet Take 81 mg by mouth daily.   esomeprazole 40 MG capsule Commonly known as:   NEXIUM Take 1 capsule (40 mg total) by mouth daily.   multivitamin with minerals Tabs tablet Take 1 tablet by mouth daily.   predniSONE 10 MG (21) Tbpk tablet Commonly known as:  STERAPRED UNI-PAK 21 TAB As directed x 6 days   tetrahydrozoline-zinc 0.05-0.25 % ophthalmic solution Commonly known as:  VISINE-AC Place 2 drops into both eyes daily as needed (allergies).          Objective:    BP 118/81   Pulse 99   Temp (!) 97.5 F (36.4 C) (Oral)   Ht _1  (1.88 m)   Wt 204 lb 9.6 oz (92.8 kg)   BMI 26.27 kg/m   No Known Allergies  Wt Readings from Last 3 Encounters:  11/07/17 204 lb 9.6 oz (92.8 kg)  10/12/17 203 lb (92.1 kg)  02/27/17 202 lb (91.6 kg)    Physical Exam  Constitutional: He appears well-developed and well-nourished. No distress.  HENT:  Head: Normocephalic and atraumatic.  Eyes: Pupils are equal, round, and reactive to light. Conjunctivae and EOM are normal.  Cardiovascular: Normal rate, regular rhythm and normal heart sounds.  Pulmonary/Chest: Effort normal and breath sounds normal. No respiratory distress.  Musculoskeletal:       Right hip: He exhibits decreased range of motion and tenderness.       Right knee: He exhibits decreased range of motion. Tenderness found.  Skin: Skin is warm  and dry.  Psychiatric: He has a normal mood and affect. His behavior is normal.  Nursing note and vitals reviewed.   Results for orders placed or performed in visit on 01/30/17  CBC with Differential/Platelet  Result Value Ref Range   WBC 7.2 3.4 - 10.8 x10E3/uL   RBC 5.11 4.14 - 5.80 x10E6/uL   Hemoglobin 14.5 13.0 - 17.7 g/dL   Hematocrit 42.8 37.5 - 51.0 %   MCV 84 79 - 97 fL   MCH 28.4 26.6 - 33.0 pg   MCHC 33.9 31.5 - 35.7 g/dL   RDW 14.1 12.3 - 15.4 %   Platelets 197 150 - 379 x10E3/uL   Neutrophils 62 Not Estab. %   Lymphs 26 Not Estab. %   Monocytes 10 Not Estab. %   Eos 2 Not Estab. %   Basos 0 Not Estab. %   Neutrophils Absolute 4.4 1.4 - 7.0  x10E3/uL   Lymphocytes Absolute 1.9 0.7 - 3.1 x10E3/uL   Monocytes Absolute 0.7 0.1 - 0.9 x10E3/uL   EOS (ABSOLUTE) 0.1 0.0 - 0.4 x10E3/uL   Basophils Absolute 0.0 0.0 - 0.2 x10E3/uL   Immature Granulocytes 0 Not Estab. %   Immature Grans (Abs) 0.0 0.0 - 0.1 x10E3/uL  CMP14+EGFR  Result Value Ref Range   Glucose 104 (H) 65 - 99 mg/dL   BUN 10 6 - 24 mg/dL   Creatinine, Ser 0.97 0.76 - 1.27 mg/dL   GFR calc non Af Amer 88 >59 mL/min/1.73   GFR calc Af Amer 102 >59 mL/min/1.73   BUN/Creatinine Ratio 10 9 - 20   Sodium 141 134 - 144 mmol/L   Potassium 4.2 3.5 - 5.2 mmol/L   Chloride 102 96 - 106 mmol/L   CO2 23 20 - 29 mmol/L   Calcium 9.3 8.7 - 10.2 mg/dL   Total Protein 6.9 6.0 - 8.5 g/dL   Albumin 4.5 3.5 - 5.5 g/dL   Globulin, Total 2.4 1.5 - 4.5 g/dL   Albumin/Globulin Ratio 1.9 1.2 - 2.2   Bilirubin Total 0.5 0.0 - 1.2 mg/dL   Alkaline Phosphatase 55 39 - 117 IU/L   AST 27 0 - 40 IU/L   ALT 38 0 - 44 IU/L  Lipid panel  Result Value Ref Range   Cholesterol, Total 196 100 - 199 mg/dL   Triglycerides 91 0 - 149 mg/dL   HDL 63 >39 mg/dL   VLDL Cholesterol Cal 18 5 - 40 mg/dL   LDL Calculated 115 (H) 0 - 99 mg/dL   Chol/HDL Ratio 3.1 0.0 - 5.0 ratio  PSA  Result Value Ref Range   Prostate Specific Ag, Serum 1.6 0.0 - 4.0 ng/mL  TSH  Result Value Ref Range   TSH 2.030 0.450 - 4.500 uIU/mL      Assessment & Plan:   1. Left foot pain - predniSONE (STERAPRED UNI-PAK 21 TAB) 10 MG (21) TBPK tablet; As directed x 6 days  Dispense: 21 tablet; Refill: 1  2. Chronic left hip pain - predniSONE (STERAPRED UNI-PAK 21 TAB) 10 MG (21) TBPK tablet; As directed x 6 days  Dispense: 21 tablet; Refill: 1   Continue all other maintenance medications as listed above.  Follow up plan: No follow-ups on file.  Educational handout given for [place] PA-C [facility] [address]  [place],  [address] [phone]   11/11/2017, 10:45  PM

## 2017-11-21 ENCOUNTER — Ambulatory Visit: Payer: 59 | Admitting: Family Medicine

## 2017-11-21 ENCOUNTER — Encounter: Payer: Self-pay | Admitting: Family Medicine

## 2017-11-21 VITALS — BP 133/84 | HR 89 | Temp 97.4°F | Ht 74.0 in | Wt 208.0 lb

## 2017-11-21 DIAGNOSIS — B9689 Other specified bacterial agents as the cause of diseases classified elsewhere: Secondary | ICD-10-CM

## 2017-11-21 DIAGNOSIS — K219 Gastro-esophageal reflux disease without esophagitis: Secondary | ICD-10-CM | POA: Diagnosis not present

## 2017-11-21 DIAGNOSIS — J019 Acute sinusitis, unspecified: Secondary | ICD-10-CM

## 2017-11-21 MED ORDER — AMOXICILLIN-POT CLAVULANATE 875-125 MG PO TABS: 1.0000 | ORAL_TABLET | Freq: Two times a day (BID) | ORAL | 0 refills | Status: DC

## 2017-11-21 MED ORDER — BENZONATATE 100 MG PO CAPS: 100.0000 mg | ORAL_CAPSULE | Freq: Three times a day (TID) | ORAL | 0 refills | Status: DC | PRN

## 2017-11-21 MED ORDER — ESOMEPRAZOLE MAGNESIUM 40 MG PO CPDR: 40.0000 mg | DELAYED_RELEASE_CAPSULE | Freq: Every day | ORAL | 3 refills | Status: DC

## 2017-11-21 NOTE — Patient Instructions (Signed)
We discussed using a probiotic while on the antibiotic today.    - Get plenty of rest and drink plenty of fluids. - Try to breathe moist air. Use a cold mist humidifier. - Consume warm fluids (soup or tea) to provide relief for a stuffy nose and to loosen phlegm. - For nasal stuffiness, try saline nasal spray or a Neti Pot. Afrin nasal spray can also be used but this product should not be used longer than 3 days or it will cause rebound nasal stuffiness (worsening nasal congestion). - For sore throat pain relief: use chloraseptic spray, suck on throat lozenges, hard candy or popsicles; gargle with warm salt water (1/4 tsp. salt per 8 oz. of water); and eat soft, bland foods. - Eat a well-balanced diet. If you cannot, ensure you are getting enough nutrients by taking a daily multivitamin. - Avoid dairy products, as they can thicken phlegm. - Avoid alcohol, as it impairs your body's immune system.  CONTACT YOUR DOCTOR IF YOU EXPERIENCE ANY OF THE FOLLOWING: - High fever - Ear pain - Sinus-type headache - Unusually severe cold symptoms - Cough that gets worse while other cold symptoms improve - Flare up of any chronic lung problem, such as asthma - Your symptoms persist longer than 2 weeks

## 2017-11-21 NOTE — Progress Notes (Signed)
Subjective: CC: URI PCP: Joel Loffler, PA-C ZOX:[id number] Joel Snyder is a 57 y.o. male presenting to clinic today for:  1. URI Patient reports onset of sinus congestion, drainage, chest congestion and headache about a week and a half ago.  He had symptoms are worse at nighttime and are worse on the left side of his head than the right.  He has felt that the symptoms wax and wane in severity.  He denies any overt facial or dental pain, hemoptysis, shortness of breath or wheeze.  No fevers.  He is used Mucinex and Alka-Seltzer with little improvement in symptoms.   ROS: Per HPI  No Known Allergies Past Medical History:  Diagnosis Date  . Family history of heart disease   . Palpitations     Current Outpatient Medications:  .  aspirin EC 81 MG tablet, Take 81 mg by mouth daily., Disp: , Rfl:  .  esomeprazole (NEXIUM) 40 MG capsule, Take 1 capsule (40 mg total) by mouth daily., Disp: 30 capsule, Rfl: 11 .  Multiple Vitamin (MULTIVITAMIN WITH MINERALS) TABS tablet, Take 1 tablet by mouth daily., Disp: , Rfl:  .  tetrahydrozoline-zinc (VISINE-AC) 0.05-0.25 % ophthalmic solution, Place 2 drops into both eyes daily as needed (allergies)., Disp: , Rfl:  Social History   Socioeconomic History  . Marital status: Married    Spouse name: Not on file  . Number of children: Not on file  . Years of education: Not on file  . Highest education level: Not on file  Occupational History  . Not on file  Social Needs  . Financial resource strain: Not on file  . Food insecurity:    Worry: Not on file    Inability: Not on file  . Transportation needs:    Medical: Not on file    Non-medical: Not on file  Tobacco Use  . Smoking status: Former Smoker    Packs/day: 1.00    Years: 12.00    Pack years: 12.00    Types: Cigarettes    Start date: 10/06/1978    Last attempt to quit: 10/06/1990    Years since quitting: 27.1  . Smokeless tobacco: Former Engineer, water and Sexual Activity  . Alcohol use:  No    Alcohol/week: 0.0 standard drinks  . Drug use: No  . Sexual activity: Not on file  Lifestyle  . Physical activity:    Days per week: Not on file    Minutes per session: Not on file  . Stress: Not on file  Relationships  . Social connections:    Talks on phone: Not on file    Gets together: Not on file    Attends religious service: Not on file    Active member of club or organization: Not on file    Attends meetings of clubs or organizations: Not on file    Relationship status: Not on file  . Intimate partner violence:    Fear of current or ex partner: Not on file    Emotionally abused: Not on file    Physically abused: Not on file    Forced sexual activity: Not on file  Other Topics Concern  . Not on file  Social History Narrative  . Not on file   Family History  Problem Relation Age of Onset  . Heart attack Father        4 heart attacks  . Heart defect Father        open heart surgery and stent placement  .  Diabetes Mother   . Cancer Paternal Grandfather   . Alzheimer's disease Maternal Grandmother   . Mental illness Maternal Grandmother   . Heart attack Brother 35       stent placement    Objective: Office vital signs reviewed. BP 133/84   Pulse 89   Temp (!) 97.4 F (36.3 C) (Oral)   Ht 6\' 2"  (1.88 m)   Wt 208 lb (94.3 kg)   SpO2 98%   BMI 26.71 kg/m   Physical Examination:  General: Awake, alert, well nourished, No acute distress HEENT: Normal    Ears: Tympanic membranes intact, normal light reflex, no erythema, no bulging    Eyes: PERRLA, extraocular membranes intact, sclera white    Nose: nasal turbinates moist, clear nasal discharge; turbinates are edematous and erythematous.  He has a deviatiated septum.    Throat: moist mucus membranes, no erythema, no tonsillar exudate.  Airway is patent Cardio: regular rate and rhythm, S1S2 heard, no murmurs appreciated Pulm: clear to auscultation bilaterally, no wheezes, rhonchi or rales; normal work of  breathing on room air  Assessment/ Plan: 57 y.o. male   1. Acute bacterial sinusitis Patient is afebrile nontoxic-appearing.  Given his waxing and waning symptoms and the increased severity at nighttime, I am empirically treating him with Augmentin 875 p.o. twice daily for 10 days for acute bacterial sinusitis.  Home care injections were reviewed with the patient.  Tessalon Perles sent for cough.  He is to follow-up if symptoms are not improving or if they worsen.  2. Gastroesophageal reflux disease without esophagitis - esomeprazole (NEXIUM) 40 MG capsule; Take 1 capsule (40 mg total) by mouth daily.  Dispense: 30 capsule; Refill: 3   No orders of the defined types were placed in this encounter.  Meds ordered this encounter  Medications  . amoxicillin-clavulanate (AUGMENTIN) 875-125 MG tablet    Sig: Take 1 tablet by mouth 2 (two) times daily.    Dispense:  20 tablet    Refill:  0  . benzonatate (TESSALON) 100 MG capsule    Sig: Take 1 capsule (100 mg total) by mouth 3 (three) times daily as needed for cough.    Dispense:  20 capsule    Refill:  0  . esomeprazole (NEXIUM) 40 MG capsule    Sig: Take 1 capsule (40 mg total) by mouth daily.    Dispense:  30 capsule    Refill:  3     Joel Ellsworth Hulen SkainsM Joel Minner, DO [facility] [phone]

## 2018-01-07 ENCOUNTER — Telehealth: Payer: Self-pay | Admitting: Physician Assistant

## 2018-01-07 NOTE — Telephone Encounter (Signed)
What symptoms do you have? Congestion runny nose spitting up stuff cough  Scratchy throat  How long have you been sick? About 4 days   Have you been seen for this problem? Yes was prescribed antibiotic, can it be refilled?  If your provider decides to give you a prescription, which pharmacy would you [facility] for [facility] to [facility]   Patient informed that this information will be sent to the clinical staff for review and that they should receive a follow up call.

## 2018-01-07 NOTE — Telephone Encounter (Signed)
Pt's wife aware he would ntbs for evaluation since he was last seen for this 11/13. Advised he could go to Urgent Care since we don't have any openings or take OTC meds to help with symptoms. Pt's wife voiced understanding and says if he isn't any better Thursday she will call back.

## 2018-04-26 ENCOUNTER — Other Ambulatory Visit: Payer: Self-pay | Admitting: Family Medicine

## 2018-04-26 DIAGNOSIS — K219 Gastro-esophageal reflux disease without esophagitis: Secondary | ICD-10-CM

## 2018-05-27 ENCOUNTER — Telehealth: Payer: Self-pay | Admitting: Physician Assistant

## 2018-05-27 DIAGNOSIS — K219 Gastro-esophageal reflux disease without esophagitis: Secondary | ICD-10-CM

## 2018-05-27 MED ORDER — ESOMEPRAZOLE MAGNESIUM 40 MG PO CPDR
40.0000 mg | DELAYED_RELEASE_CAPSULE | Freq: Every day | ORAL | 0 refills | Status: DC
Start: 2018-05-27 — End: 2018-06-04

## 2018-05-27 NOTE — Telephone Encounter (Signed)
What is the name of the medication? NEXIUM   Have you contacted your pharmacy to request a refill? Yes he has appt for med refill on 06/04/2018  Which pharmacy would you like this sent to? [facility]   Patient notified that their request is being sent to the clinical staff for review and that they should receive a call once it is complete. If they do not receive a call within 24 hours they can check with their pharmacy or our office.

## 2018-05-27 NOTE — Telephone Encounter (Signed)
LMOVM refill sent to pharmacy 

## 2018-06-04 ENCOUNTER — Other Ambulatory Visit: Payer: Self-pay

## 2018-06-04 ENCOUNTER — Encounter: Payer: Self-pay | Admitting: Physician Assistant

## 2018-06-04 ENCOUNTER — Ambulatory Visit (INDEPENDENT_AMBULATORY_CARE_PROVIDER_SITE_OTHER): Payer: 59 | Admitting: Physician Assistant

## 2018-06-04 DIAGNOSIS — K219 Gastro-esophageal reflux disease without esophagitis: Secondary | ICD-10-CM

## 2018-06-04 DIAGNOSIS — Z0000 Encounter for general adult medical examination without abnormal findings: Secondary | ICD-10-CM

## 2018-06-04 MED ORDER — ESOMEPRAZOLE MAGNESIUM 40 MG PO CPDR: 40.0000 mg | DELAYED_RELEASE_CAPSULE | Freq: Every day | ORAL | 3 refills | Status: DC

## 2018-06-04 NOTE — Progress Notes (Signed)
    Telephone visit  Subjective: MW:UXLK, needs well labs PCP: Terald Sleeper, PA-C GMW:[id number] Joel Snyder is a 58 y.o. male calls for telephone consult today. Patient provides verbal consent for consult held via phone.  Patient is identified with 2 separate identifiers.  At this time the entire area is on COVID-19 social distancing and stay home orders are in place.  Patient is of higher risk and therefore we are performing this by a virtual method.  Location of patient: work Location of provider: HOME Others present for call: no  This patient is having a phone visit for refills on his Nexium.  He has been taking this for couple years.  He states it is still helping tremendously with his GERD.  He has not had any breakthrough or symptoms.  He has not had any nighttime symptoms.  He states overall he has been doing very well and has no new medical complaints.  He does need to have annual labs performed.  His last ones were performed January 2019.  An order will place that he will come the summer for fasting labs.   ROS: Per HPI  No Known Allergies Past Medical History:  Diagnosis Date  . Family history of heart disease   . Palpitations     Current Outpatient Medications:  .  aspirin EC 81 MG tablet, Take 81 mg by mouth daily., Disp: , Rfl:  .  esomeprazole (NEXIUM) 40 MG capsule, Take 1 capsule (40 mg total) by mouth daily., Disp: 90 capsule, Rfl: 3 .  Multiple Vitamin (MULTIVITAMIN WITH MINERALS) TABS tablet, Take 1 tablet by mouth daily., Disp: , Rfl:  .  tetrahydrozoline-zinc (VISINE-AC) 0.05-0.25 % ophthalmic solution, Place 2 drops into both eyes daily as needed (allergies)., Disp: , Rfl:   Assessment/ Plan: 58 y.o. male   1. Gastroesophageal reflux disease without esophagitis - esomeprazole (NEXIUM) 40 MG capsule; Take 1 capsule (40 mg total) by mouth daily.  Dispense: 90 capsule; Refill: 3  2. Well adult exam - CBC with Differential/Platelet; Future - CMP14+EGFR; Future -  Lipid panel; Future - TSH; Future - PSA; Future   Start time: 10:24 AM End time: 10:31 AM  Meds ordered this encounter  Medications  . esomeprazole (NEXIUM) 40 MG capsule    Sig: Take 1 capsule (40 mg total) by mouth daily.    Dispense:  90 capsule    Refill:  3    Order Specific Question:   Supervising Provider    Answer:   Janora Norlander [[id number]]    Particia Nearing PA-C [place] [phone]

## 2018-08-20 ENCOUNTER — Other Ambulatory Visit: Payer: 59

## 2018-08-20 ENCOUNTER — Other Ambulatory Visit: Payer: Self-pay

## 2018-08-20 DIAGNOSIS — Z0000 Encounter for general adult medical examination without abnormal findings: Secondary | ICD-10-CM

## 2018-08-21 LAB — CBC WITH DIFFERENTIAL/PLATELET
Basophils Absolute: 0.1 10*3/uL (ref 0.0–0.2)
Basos: 1 %
EOS (ABSOLUTE): 0.1 10*3/uL (ref 0.0–0.4)
Eos: 2 %
Hematocrit: 42.5 % (ref 37.5–51.0)
Hemoglobin: 14.1 g/dL (ref 13.0–17.7)
Immature Grans (Abs): 0 10*3/uL (ref 0.0–0.1)
Immature Granulocytes: 0 %
Lymphocytes Absolute: 1.5 10*3/uL (ref 0.7–3.1)
Lymphs: 28 %
MCH: 27.5 pg (ref 26.6–33.0)
MCHC: 33.2 g/dL (ref 31.5–35.7)
MCV: 83 fL (ref 79–97)
Monocytes Absolute: 0.7 10*3/uL (ref 0.1–0.9)
Monocytes: 13 %
Neutrophils Absolute: 2.9 10*3/uL (ref 1.4–7.0)
Neutrophils: 56 %
Platelets: 214 10*3/uL (ref 150–450)
RBC: 5.13 x10E6/uL (ref 4.14–5.80)
RDW: 13.1 % (ref 11.6–15.4)
WBC: 5.2 10*3/uL (ref 3.4–10.8)

## 2018-08-21 LAB — CMP14+EGFR
ALT: 23 IU/L (ref 0–44)
AST: 22 IU/L (ref 0–40)
Albumin/Globulin Ratio: 1.7 (ref 1.2–2.2)
Albumin: 4.3 g/dL (ref 3.8–4.9)
Alkaline Phosphatase: 57 IU/L (ref 39–117)
BUN/Creatinine Ratio: 9 (ref 9–20)
BUN: 12 mg/dL (ref 6–24)
Bilirubin Total: 0.3 mg/dL (ref 0.0–1.2)
CO2: 23 mmol/L (ref 20–29)
Calcium: 8.9 mg/dL (ref 8.7–10.2)
Chloride: 105 mmol/L (ref 96–106)
Creatinine, Ser: 1.34 mg/dL — ABNORMAL HIGH (ref 0.76–1.27)
GFR calc Af Amer: 68 mL/min/{1.73_m2} (ref >59)
GFR calc non Af Amer: 59 mL/min/{1.73_m2} — ABNORMAL LOW (ref >59)
Globulin, Total: 2.5 g/dL (ref 1.5–4.5)
Glucose: 101 mg/dL — ABNORMAL HIGH (ref 65–99)
Potassium: 4.5 mmol/L (ref 3.5–5.2)
Sodium: 143 mmol/L (ref 134–144)
Total Protein: 6.8 g/dL (ref 6.0–8.5)

## 2018-08-21 LAB — LIPID PANEL
Chol/HDL Ratio: 3.6 ratio (ref 0.0–5.0)
Cholesterol, Total: 204 mg/dL — ABNORMAL HIGH (ref 100–199)
HDL: 56 mg/dL (ref >39)
LDL Calculated: 122 mg/dL — ABNORMAL HIGH (ref 0–99)
Triglycerides: 132 mg/dL (ref 0–149)
VLDL Cholesterol Cal: 26 mg/dL (ref 5–40)

## 2018-08-21 LAB — TSH: TSH: 1.78 u[IU]/mL (ref 0.450–4.500)

## 2018-08-21 LAB — PSA: Prostate Specific Ag, Serum: 1.1 ng/mL (ref 0.0–4.0)

## 2019-01-31 ENCOUNTER — Telehealth: Payer: Self-pay | Admitting: Physician Assistant

## 2019-01-31 ENCOUNTER — Other Ambulatory Visit: Payer: Self-pay | Admitting: Physician Assistant

## 2019-01-31 DIAGNOSIS — N289 Disorder of kidney and ureter, unspecified: Secondary | ICD-10-CM

## 2019-01-31 NOTE — Telephone Encounter (Signed)
Order has been placed.

## 2019-01-31 NOTE — Telephone Encounter (Signed)
Left detailed message stating lab orders have been placed.  

## 2019-02-04 ENCOUNTER — Other Ambulatory Visit: Payer: Self-pay

## 2019-02-04 ENCOUNTER — Other Ambulatory Visit: Payer: 59

## 2019-02-04 DIAGNOSIS — N289 Disorder of kidney and ureter, unspecified: Secondary | ICD-10-CM

## 2019-02-04 LAB — CMP14+EGFR
ALT: 22 IU/L (ref 0–44)
AST: 26 IU/L (ref 0–40)
Albumin/Globulin Ratio: 1.9 (ref 1.2–2.2)
Albumin: 4.4 g/dL (ref 3.8–4.9)
Alkaline Phosphatase: 63 IU/L (ref 39–117)
BUN/Creatinine Ratio: 12 (ref 9–20)
BUN: 12 mg/dL (ref 6–24)
Bilirubin Total: 0.5 mg/dL (ref 0.0–1.2)
CO2: 22 mmol/L (ref 20–29)
Calcium: 9.3 mg/dL (ref 8.7–10.2)
Chloride: 104 mmol/L (ref 96–106)
Creatinine, Ser: 1.03 mg/dL (ref 0.76–1.27)
GFR calc Af Amer: 93 mL/min/{1.73_m2} (ref >59)
GFR calc non Af Amer: 81 mL/min/{1.73_m2} (ref >59)
Globulin, Total: 2.3 g/dL (ref 1.5–4.5)
Glucose: 106 mg/dL — ABNORMAL HIGH (ref 65–99)
Potassium: 4.3 mmol/L (ref 3.5–5.2)
Sodium: 142 mmol/L (ref 134–144)
Total Protein: 6.7 g/dL (ref 6.0–8.5)

## 2019-06-26 ENCOUNTER — Encounter: Payer: Self-pay | Admitting: Family

## 2019-06-26 ENCOUNTER — Ambulatory Visit (INDEPENDENT_AMBULATORY_CARE_PROVIDER_SITE_OTHER): Payer: 59 | Admitting: Family

## 2019-06-26 DIAGNOSIS — K219 Gastro-esophageal reflux disease without esophagitis: Secondary | ICD-10-CM

## 2019-06-26 DIAGNOSIS — Z1211 Encounter for screening for malignant neoplasm of colon: Secondary | ICD-10-CM

## 2019-06-26 DIAGNOSIS — M1612 Unilateral primary osteoarthritis, left hip: Secondary | ICD-10-CM | POA: Diagnosis not present

## 2019-06-26 MED ORDER — ESOMEPRAZOLE MAGNESIUM 40 MG PO CPDR: 40.0000 mg | DELAYED_RELEASE_CAPSULE | Freq: Every day | ORAL | 3 refills | Status: DC

## 2019-06-26 NOTE — Progress Notes (Signed)
   Virtual Visit via telephone Note Due to COVID-19 pandemic this visit was conducted virtually. This visit type was conducted due to national recommendations for restrictions regarding the COVID-19 Pandemic (e.g. social distancing, sheltering in place) in an effort to limit this patient's exposure and mitigate transmission in our community. All issues noted in this document were discussed and addressed.  A physical exam was not performed with this format.  I connected with Joel Snyder on 06/26/19 at 11:25 AM by telephone and verified that I am speaking with the correct person using two identifiers. Joel Snyder is currently located at work and no one is currently with him  during visit. The provider, Jannifer Rodney, FNP is located in their office at time of visit.  I discussed the limitations, risks, security and privacy concerns of performing an evaluation and management service by telephone and the availability of in person appointments. I also discussed with the patient that there may be a patient responsible charge related to this service. The patient expressed understanding and agreed to proceed.   History and Present Illness:  Gastroesophageal Reflux He complains of belching and heartburn. This is a chronic problem. The current episode started more than 1 year ago. The problem occurs occasionally. The problem has been waxing and waning. He has tried a PPI for the symptoms. The treatment provided moderate relief.  Arthritis Presents for follow-up visit. He complains of pain and stiffness. Affected locations include the left hip. His pain is at a severity of 2/10.      Review of Systems  Gastrointestinal: Positive for heartburn.  Musculoskeletal: Positive for arthritis and stiffness.  All other systems reviewed and are negative.    Observations/Objective: No SOB or distress noted  Assessment and Plan: 1. Gastroesophageal reflux disease without esophagitis -Diet discussed-  Avoid fried, spicy, citrus foods, caffeine and alcohol -Do not eat 2-3 hours before bedtime -Encouraged small frequent meals -Avoid NSAID's - esomeprazole (NEXIUM) 40 MG capsule; Take 1 capsule (40 mg total) by mouth daily.  Dispense: 90 capsule; Refill: 3  2. Colon cancer screening - Cologuard  3. Primary osteoarthritis of left hip     I discussed the assessment and treatment plan with the patient. The patient was provided an opportunity to ask questions and all were answered. The patient agreed with the plan and demonstrated an understanding of the instructions.   The patient was advised to call back or seek an in-person evaluation if the symptoms worsen or if the condition fails to improve as anticipated.  The above assessment and management plan was discussed with the patient. The patient verbalized understanding of and has agreed to the management plan. Patient is aware to call the clinic if symptoms persist or worsen. Patient is aware when to return to the clinic for a follow-up visit. Patient educated on when it is appropriate to go to the emergency department.   Time call ended:  11:35 AM  I provided 10 minutes of non-face-to-face time during this encounter.    Jannifer Rodney, FNP

## 2019-07-31 LAB — COLOGUARD: COLOGUARD: NEGATIVE

## 2019-07-31 LAB — EXTERNAL GENERIC LAB PROCEDURE: COLOGUARD: NEGATIVE

## 2019-08-05 LAB — COLOGUARD: Cologuard: NEGATIVE

## 2020-05-31 ENCOUNTER — Encounter: Payer: Self-pay | Admitting: Family Medicine

## 2020-05-31 ENCOUNTER — Other Ambulatory Visit: Payer: Self-pay

## 2020-05-31 ENCOUNTER — Ambulatory Visit (INDEPENDENT_AMBULATORY_CARE_PROVIDER_SITE_OTHER): Payer: 59 | Admitting: Family Medicine

## 2020-05-31 VITALS — BP 124/76 | HR 90 | Temp 98.1°F | Ht 74.0 in | Wt 200.0 lb

## 2020-05-31 DIAGNOSIS — R0781 Pleurodynia: Secondary | ICD-10-CM

## 2020-05-31 DIAGNOSIS — K219 Gastro-esophageal reflux disease without esophagitis: Secondary | ICD-10-CM | POA: Diagnosis not present

## 2020-05-31 MED ORDER — PREDNISONE 10 MG (21) PO TBPK: ORAL_TABLET | ORAL | 0 refills | Status: DC

## 2020-05-31 MED ORDER — ESOMEPRAZOLE MAGNESIUM 40 MG PO CPDR: 40.0000 mg | DELAYED_RELEASE_CAPSULE | Freq: Every day | ORAL | 1 refills | Status: DC

## 2020-05-31 NOTE — Progress Notes (Signed)
Acute Office Visit  Subjective:    Patient ID: Joel Snyder, male    DOB: 07-23-1960, 60 y.o.   MRN: [id number]  Chief Complaint  Patient presents with  . Rib Injury    HPI Patient is in today for a rib injury on the right side 3 weeks ago. This occurred after an injury where he hit his ribs on a truck bed. The pain had been improving until he sneezed hard yesterday. This cause his pain to increase. The pain is a 5/10 and constant with intermittent sharp pain. The pain is worse with breathing. Denies fever, cough, or shortness of breath. He has not taken anything for the pain.    He needs a refill on his Nexium. Reports GERD that is well controlled with this and diet. Denies nausea, vomiting, or weight loss.   Past Medical History:  Diagnosis Date  . Family history of heart disease   . Palpitations     No past surgical history on file.  Family History  Problem Relation Age of Onset  . Heart attack Father        4 heart attacks  . Heart defect Father        open heart surgery and stent placement  . Diabetes Mother   . Cancer Paternal Grandfather   . Alzheimer's disease Maternal Grandmother   . Mental illness Maternal Grandmother   . Heart attack Brother 35       stent placement    Social History   Socioeconomic History  . Marital status: Married    Spouse name: Not on file  . Number of children: Not on file  . Years of education: Not on file  . Highest education level: Not on file  Occupational History  . Not on file  Tobacco Use  . Smoking status: Former Smoker    Packs/day: 1.00    Years: 12.00    Pack years: 12.00    Types: Cigarettes    Start date: 10/06/1978    Quit date: 10/06/1990    Years since quitting: 29.6  . Smokeless tobacco: Former Clinical biochemist  . Vaping Use: Never used  Substance and Sexual Activity  . Alcohol use: No    Alcohol/week: 0.0 standard drinks  . Drug use: No  . Sexual activity: Not on file  Other Topics Concern  . Not  on file  Social History Narrative  . Not on file   Social Determinants of Health   Financial Resource Strain: Not on file  Food Insecurity: Not on file  Transportation Needs: Not on file  Physical Activity: Not on file  Stress: Not on file  Social Connections: Not on file  Intimate Partner Violence: Not on file    Outpatient Medications Prior to Visit  Medication Sig Dispense Refill  . aspirin EC 81 MG tablet Take 81 mg by mouth daily.    Marland Kitchen esomeprazole (NEXIUM) 40 MG capsule Take 1 capsule (40 mg total) by mouth daily. 90 capsule 3  . Multiple Vitamin (MULTIVITAMIN WITH MINERALS) TABS tablet Take 1 tablet by mouth daily.    . Multiple Vitamins-Minerals (AIRBORNE PO) Take by mouth.    . tetrahydrozoline-zinc (VISINE-AC) 0.05-0.25 % ophthalmic solution Place 2 drops into both eyes daily as needed (allergies).     No facility-administered medications prior to visit.    No Known Allergies  Review of Systems As per HPI.     Objective:    Physical Exam Vitals and nursing note reviewed.  Constitutional:      General: He is not in acute distress.    Appearance: Normal appearance. He is not ill-appearing, toxic-appearing or diaphoretic.  Cardiovascular:     Rate and Rhythm: Normal rate and regular rhythm.     Heart sounds: Normal heart sounds. No murmur heard.   Pulmonary:     Effort: Pulmonary effort is normal. No respiratory distress.     Breath sounds: Normal breath sounds. No wheezing, rhonchi or rales.  Chest:     Chest wall: No tenderness.    Abdominal:     General: There is no distension.     Palpations: Abdomen is soft.     Tenderness: There is no abdominal tenderness. There is no guarding or rebound.  Musculoskeletal:     Right lower leg: No edema.     Left lower leg: No edema.  Skin:    General: Skin is warm and dry.  Neurological:     General: No focal deficit present.     Mental Status: He is alert and oriented to person, place, and time.     BP  124/76   Pulse 90   Temp 98.1 F (36.7 C) (Temporal)   Ht 6\' 2"  (1.88 m)   Wt 200 lb (90.7 kg)   BMI 25.68 kg/m  Wt Readings from Last 3 Encounters:  05/31/20 200 lb (90.7 kg)  11/21/17 208 lb (94.3 kg)  11/07/17 204 lb 9.6 oz (92.8 kg)    Health Maintenance Due  Topic Date Due  . COVID-19 Vaccine (1) Never done  . Hepatitis C Screening  Never done  . COLONOSCOPY (Pts 45-57yrs Insurance coverage will need to be confirmed)  Never done    There are no preventive care reminders to display for this patient.   Lab Results  Component Value Date   TSH 1.780 08/20/2018   Lab Results  Component Value Date   WBC 5.2 08/20/2018   HGB 14.1 08/20/2018   HCT 42.5 08/20/2018   MCV 83 08/20/2018   PLT 214 08/20/2018   Lab Results  Component Value Date   NA 142 02/04/2019   K 4.3 02/04/2019   CO2 22 02/04/2019   GLUCOSE 106 (H) 02/04/2019   BUN 12 02/04/2019   CREATININE 1.03 02/04/2019   BILITOT 0.5 02/04/2019   ALKPHOS 63 02/04/2019   AST 26 02/04/2019   ALT 22 02/04/2019   PROT 6.7 02/04/2019   ALBUMIN 4.4 02/04/2019   CALCIUM 9.3 02/04/2019   ANIONGAP 7 04/21/2015   Lab Results  Component Value Date   CHOL 204 (H) 08/20/2018   Lab Results  Component Value Date   HDL 56 08/20/2018   Lab Results  Component Value Date   LDLCALC 122 (H) 08/20/2018   Lab Results  Component Value Date   TRIG 132 08/20/2018   Lab Results  Component Value Date   CHOLHDL 3.6 08/20/2018   No results found for: HGBA1C     Assessment & Plan:   Adama was seen today for rib injury.  Diagnoses and all orders for this visit:  Rib pain on right side Prednisone as below. Discussed advil, heat, ice as needed. Return to office for new or worsening symptoms, or if symptoms persist.  -     predniSONE (STERAPRED UNI-PAK 21 TAB) 10 MG (21) TBPK tablet; Use as directed on back of pill pack  Gastroesophageal reflux disease without esophagitis Refill provided. Well controlled on  current regimen.  -     esomeprazole (  NEXIUM) 40 MG capsule; Take 1 capsule (40 mg total) by mouth daily.  Return in about 4 weeks (around 06/28/2020) for physical with PCP.  The patient indicates understanding of these issues and agrees with the plan.   Gabriel Earing, FNP

## 2020-05-31 NOTE — Patient Instructions (Signed)

## 2020-06-28 ENCOUNTER — Ambulatory Visit (INDEPENDENT_AMBULATORY_CARE_PROVIDER_SITE_OTHER): Payer: 59 | Admitting: Family

## 2020-06-28 ENCOUNTER — Encounter: Payer: Self-pay | Admitting: Family

## 2020-06-28 ENCOUNTER — Other Ambulatory Visit: Payer: Self-pay

## 2020-06-28 VITALS — BP 127/84 | HR 93 | Temp 98.0°F | Ht 74.0 in | Wt 197.4 lb

## 2020-06-28 DIAGNOSIS — Z0000 Encounter for general adult medical examination without abnormal findings: Secondary | ICD-10-CM

## 2020-06-28 DIAGNOSIS — Z0001 Encounter for general adult medical examination with abnormal findings: Secondary | ICD-10-CM | POA: Diagnosis not present

## 2020-06-28 DIAGNOSIS — K219 Gastro-esophageal reflux disease without esophagitis: Secondary | ICD-10-CM | POA: Diagnosis not present

## 2020-06-28 DIAGNOSIS — M1612 Unilateral primary osteoarthritis, left hip: Secondary | ICD-10-CM | POA: Diagnosis not present

## 2020-06-28 MED ORDER — ESOMEPRAZOLE MAGNESIUM 40 MG PO CPDR: 40.0000 mg | DELAYED_RELEASE_CAPSULE | Freq: Every day | ORAL | 4 refills | Status: DC

## 2020-06-28 NOTE — Progress Notes (Signed)
Subjective:    Patient ID: Joel Snyder, male    DOB: 07/27/1960, 60 y.o.   MRN: [id number]  Chief Complaint  Patient presents with   Annual Exam    Pt presents to the office today for CPE.   Gastroesophageal Reflux He complains of belching and heartburn. This is a chronic problem. The current episode started more than 1 year ago. The problem occurs occasionally. He has tried a PPI for the symptoms. The treatment provided moderate relief.  Arthritis Presents for follow-up visit. He complains of pain and stiffness. The symptoms have been stable. Affected locations include the right hip and left hip. His pain is at a severity of 2/10.     Review of Systems  Gastrointestinal:  Positive for heartburn.  Musculoskeletal:  Positive for arthritis and stiffness.  All other systems reviewed and are negative.  Family History  Problem Relation Age of Onset   Heart attack Father        4 heart attacks   Heart defect Father        open heart surgery and stent placement   Diabetes Mother    Cancer Paternal Grandfather    Alzheimer's disease Maternal Grandmother    Mental illness Maternal Grandmother    Heart attack Brother 105       stent placement   Social History   Socioeconomic History   Marital status: Married    Spouse name: Not on file   Number of children: Not on file   Years of education: Not on file   Highest education level: Not on file  Occupational History   Not on file  Tobacco Use   Smoking status: Former    Packs/day: 1.00    Years: 12.00    Pack years: 12.00    Types: Cigarettes    Start date: 10/06/1978    Quit date: 10/06/1990    Years since quitting: 29.7   Smokeless tobacco: Former  Scientific laboratory technician Use: Never used  Substance and Sexual Activity   Alcohol use: No    Alcohol/week: 0.0 standard drinks   Drug use: No   Sexual activity: Not on file  Other Topics Concern   Not on file  Social History Narrative   Not on file   Social Determinants  of Health   Financial Resource Strain: Not on file  Food Insecurity: Not on file  Transportation Needs: Not on file  Physical Activity: Not on file  Stress: Not on file  Social Connections: Not on file       Objective:   Physical Exam Vitals reviewed.  Constitutional:      General: He is not in acute distress.    Appearance: He is well-developed.  HENT:     Head: Normocephalic.     Right Ear: Tympanic membrane normal.     Left Ear: Tympanic membrane normal.  Eyes:     General:        Right eye: No discharge.        Left eye: No discharge.     Pupils: Pupils are equal, round, and reactive to light.  Neck:     Thyroid: No thyromegaly.  Cardiovascular:     Rate and Rhythm: Normal rate and regular rhythm.     Heart sounds: Normal heart sounds. No murmur heard. Pulmonary:     Effort: Pulmonary effort is normal. No respiratory distress.     Breath sounds: Normal breath sounds. No wheezing.  Abdominal:  General: Bowel sounds are normal. There is no distension.     Palpations: Abdomen is soft.     Tenderness: There is no abdominal tenderness.  Musculoskeletal:        General: No tenderness. Normal range of motion.     Cervical back: Normal range of motion and neck supple.  Skin:    General: Skin is warm and dry.     Findings: No erythema or rash.  Neurological:     Mental Status: He is alert and oriented to person, place, and time.     Cranial Nerves: No cranial nerve deficit.     Deep Tendon Reflexes: Reflexes are normal and symmetric.  Psychiatric:        Behavior: Behavior normal.        Thought Content: Thought content normal.        Judgment: Judgment normal.         BP 127/84   Pulse 93   Temp 98 F (36.7 C) (Temporal)   Ht 6' 2"  (1.88 m)   Wt 197 lb 6 oz (89.5 kg)   BMI 25.34 kg/m   Assessment & Plan:  [name] comes in today with chief complaint of Annual Exam   Diagnosis and orders addressed:  1. Annual physical exam - CBC with  Differential/Platelet; Future - Lipid panel; Future - CMP14+EGFR; Future - PSA, total and free; Future - TSH; Future - Hepatitis C antibody; Future  2. Gastroesophageal reflux disease without esophagitis - CBC with Differential/Platelet; Future - CMP14+EGFR; Future - esomeprazole (NEXIUM) 40 MG capsule; Take 1 capsule (40 mg total) by mouth daily.  Dispense: 90 capsule; Refill: 4  3. Primary osteoarthritis of left hip - CBC with Differential/Platelet; Future - CMP14+EGFR; Future   Labs pending Health Maintenance reviewed Diet and exercise encouraged  Follow up plan: 1 year    Evelina Dun, FNP

## 2020-06-28 NOTE — Patient Instructions (Signed)
Health Maintenance, Male Adopting a healthy lifestyle and getting preventive care are important in promoting health and wellness. Ask your health care provider about: The right schedule for you to have regular tests and exams. Things you can do on your own to prevent diseases and keep yourself healthy. What should I know about diet, weight, and exercise? Eat a healthy diet  Eat a diet that includes plenty of vegetables, fruits, low-fat dairy products, and lean protein. Do not eat a lot of foods that are high in solid fats, added sugars, or sodium.  Maintain a healthy weight Body mass index (BMI) is a measurement that can be used to identify possible weight problems. It estimates body fat based on height and weight. Your health care provider can help determine your BMI and help you achieve or maintain ahealthy weight. Get regular exercise Get regular exercise. This is one of the most important things you can do for your health. Most adults should: Exercise for at least 150 minutes each week. The exercise should increase your heart rate and make you sweat (moderate-intensity exercise). Do strengthening exercises at least twice a week. This is in addition to the moderate-intensity exercise. Spend less time sitting. Even light physical activity can be beneficial. Watch cholesterol and blood lipids Have your blood tested for lipids and cholesterol at 58 years of age, then havethis test every 5 years. You may need to have your cholesterol levels checked more often if: Your lipid or cholesterol levels are high. You are older than 58 years of age. You are at high risk for heart disease. What should I know about cancer screening? Many types of cancers can be detected early and may often be prevented. Depending on your health history and family history, you may need to have cancer screening at various ages. This may include screening for: Colorectal cancer. Prostate cancer. Skin cancer. Lung  cancer. What should I know about heart disease, diabetes, and high blood pressure? Blood pressure and heart disease High blood pressure causes heart disease and increases the risk of stroke. This is more likely to develop in people who have high blood pressure readings, are of African descent, or are overweight. Talk with your health care provider about your target blood pressure readings. Have your blood pressure checked: Every 3-5 years if you are 18-39 years of age. Every year if you are 40 years old or older. If you are between the ages of 65 and 75 and are a current or former smoker, ask your health care provider if you should have a one-time screening for abdominal aortic aneurysm (AAA). Diabetes Have regular diabetes screenings. This checks your fasting blood sugar level. Have the screening done: Once every three years after age 45 if you are at a normal weight and have a low risk for diabetes. More often and at a younger age if you are overweight or have a high risk for diabetes. What should I know about preventing infection? Hepatitis B If you have a higher risk for hepatitis B, you should be screened for this virus. Talk with your health care provider to find out if you are at risk forhepatitis B infection. Hepatitis C Blood testing is recommended for: Everyone born from 1945 through 1965. Anyone with known risk factors for hepatitis C. Sexually transmitted infections (STIs) You should be screened each year for STIs, including gonorrhea and chlamydia, if: You are sexually active and are younger than 58 years of age. You are older than 58 years of age   and your health care provider tells you that you are at risk for this type of infection. Your sexual activity has changed since you were last screened, and you are at increased risk for chlamydia or gonorrhea. Ask your health care provider if you are at risk. Ask your health care provider about whether you are at high risk for HIV.  Your health care provider may recommend a prescription medicine to help prevent HIV infection. If you choose to take medicine to prevent HIV, you should first get tested for HIV. You should then be tested every 3 months for as long as you are taking the medicine. Follow these instructions at home: Lifestyle Do not use any products that contain nicotine or tobacco, such as cigarettes, e-cigarettes, and chewing tobacco. If you need help quitting, ask your health care provider. Do not use street drugs. Do not share needles. Ask your health care provider for help if you need support or information about quitting drugs. Alcohol use Do not drink alcohol if your health care provider tells you not to drink. If you drink alcohol: Limit how much you have to 0-2 drinks a day. Be aware of how much alcohol is in your drink. In the [place] one drink equals one 12 oz bottle of beer (355 mL), one 5 oz glass of wine (148 mL), or one 1 oz glass of hard liquor (44 mL). General instructions Schedule regular health, dental, and eye exams. Stay current with your vaccines. Tell your health care provider if: You often feel depressed. You have ever been abused or do not feel safe at home. Summary Adopting a healthy lifestyle and getting preventive care are important in promoting health and wellness. Follow your health care provider's instructions about healthy diet, exercising, and getting tested or screened for diseases. Follow your health care provider's instructions on monitoring your cholesterol and blood pressure. This information is not intended to replace advice given to you by your health care provider. Make sure you discuss any questions you have with your healthcare provider. Document Revised: 12/19/2017 Document Reviewed: 12/19/2017 Elsevier Patient Education  2022 [employer] Inc.  

## 2020-08-11 ENCOUNTER — Other Ambulatory Visit: Payer: Self-pay

## 2020-08-11 ENCOUNTER — Other Ambulatory Visit: Payer: 59

## 2020-08-11 DIAGNOSIS — Z0000 Encounter for general adult medical examination without abnormal findings: Secondary | ICD-10-CM

## 2020-08-11 DIAGNOSIS — K219 Gastro-esophageal reflux disease without esophagitis: Secondary | ICD-10-CM

## 2020-08-11 DIAGNOSIS — M1612 Unilateral primary osteoarthritis, left hip: Secondary | ICD-10-CM

## 2020-08-12 LAB — CBC WITH DIFFERENTIAL/PLATELET
Basophils Absolute: 0.1 10*3/uL (ref 0.0–0.2)
Basos: 1 %
EOS (ABSOLUTE): 0.1 10*3/uL (ref 0.0–0.4)
Eos: 1 %
Hematocrit: 44.7 % (ref 37.5–51.0)
Hemoglobin: 14.7 g/dL (ref 13.0–17.7)
Immature Grans (Abs): 0 10*3/uL (ref 0.0–0.1)
Immature Granulocytes: 0 %
Lymphocytes Absolute: 1.6 10*3/uL (ref 0.7–3.1)
Lymphs: 26 %
MCH: 28 pg (ref 26.6–33.0)
MCHC: 32.9 g/dL (ref 31.5–35.7)
MCV: 85 fL (ref 79–97)
Monocytes Absolute: 0.7 10*3/uL (ref 0.1–0.9)
Monocytes: 11 %
Neutrophils Absolute: 3.9 10*3/uL (ref 1.4–7.0)
Neutrophils: 61 %
Platelets: 219 10*3/uL (ref 150–450)
RBC: 5.25 x10E6/uL (ref 4.14–5.80)
RDW: 13.1 % (ref 11.6–15.4)
WBC: 6.3 10*3/uL (ref 3.4–10.8)

## 2020-08-12 LAB — CMP14+EGFR
ALT: 24 IU/L (ref 0–44)
AST: 23 IU/L (ref 0–40)
Albumin/Globulin Ratio: 1.9 (ref 1.2–2.2)
Albumin: 4.7 g/dL (ref 3.8–4.9)
Alkaline Phosphatase: 61 IU/L (ref 44–121)
BUN/Creatinine Ratio: 12 (ref 9–20)
BUN: 13 mg/dL (ref 6–24)
Bilirubin Total: 0.4 mg/dL (ref 0.0–1.2)
CO2: 23 mmol/L (ref 20–29)
Calcium: 9.5 mg/dL (ref 8.7–10.2)
Chloride: 104 mmol/L (ref 96–106)
Creatinine, Ser: 1.12 mg/dL (ref 0.76–1.27)
Globulin, Total: 2.5 g/dL (ref 1.5–4.5)
Glucose: 97 mg/dL (ref 65–99)
Potassium: 4.5 mmol/L (ref 3.5–5.2)
Sodium: 142 mmol/L (ref 134–144)
Total Protein: 7.2 g/dL (ref 6.0–8.5)
eGFR: 77 mL/min/{1.73_m2} (ref >59)

## 2020-08-12 LAB — LIPID PANEL
Chol/HDL Ratio: 3.7 ratio (ref 0.0–5.0)
Cholesterol, Total: 232 mg/dL — ABNORMAL HIGH (ref 100–199)
HDL: 62 mg/dL (ref >39)
LDL Chol Calc (NIH): 138 mg/dL — ABNORMAL HIGH (ref 0–99)
Triglycerides: 178 mg/dL — ABNORMAL HIGH (ref 0–149)
VLDL Cholesterol Cal: 32 mg/dL (ref 5–40)

## 2020-08-12 LAB — PSA, TOTAL AND FREE
PSA, Free Pct: 37.5 %
PSA, Free: 0.3 ng/mL
Prostate Specific Ag, Serum: 0.8 ng/mL (ref 0.0–4.0)

## 2020-08-12 LAB — HEPATITIS C ANTIBODY: Hep C Virus Ab: <0.1 s/co ratio (ref 0.0–0.9)

## 2020-08-12 LAB — TSH: TSH: 1.62 u[IU]/mL (ref 0.450–4.500)

## 2021-06-02 DIAGNOSIS — H2513 Age-related nuclear cataract, bilateral: Secondary | ICD-10-CM | POA: Diagnosis not present

## 2021-06-02 DIAGNOSIS — H01002 Unspecified blepharitis right lower eyelid: Secondary | ICD-10-CM | POA: Diagnosis not present

## 2021-06-02 DIAGNOSIS — H01001 Unspecified blepharitis right upper eyelid: Secondary | ICD-10-CM | POA: Diagnosis not present

## 2021-06-02 DIAGNOSIS — H01004 Unspecified blepharitis left upper eyelid: Secondary | ICD-10-CM | POA: Diagnosis not present

## 2021-07-25 ENCOUNTER — Telehealth: Payer: Self-pay | Admitting: Family

## 2021-07-25 ENCOUNTER — Other Ambulatory Visit: Payer: Self-pay

## 2021-07-25 DIAGNOSIS — K219 Gastro-esophageal reflux disease without esophagitis: Secondary | ICD-10-CM

## 2021-07-25 MED ORDER — ESOMEPRAZOLE MAGNESIUM 40 MG PO CPDR: 40.0000 mg | DELAYED_RELEASE_CAPSULE | Freq: Every day | ORAL | 0 refills | Status: DC

## 2021-07-25 NOTE — Telephone Encounter (Signed)
30 day supply has been sent In , called left vm for pt

## 2021-07-25 NOTE — Telephone Encounter (Signed)
  Prescription Request  07/25/2021  Is this a "Controlled Substance" medicine? no  Have you seen your PCP in the last 2 weeks? no  If YES, route message to pool  -  If NO, patient needs to be scheduled for appointment.  What is the name of the medication or equipment? Esomeprazole 40 mg  Have you contacted your pharmacy to request a refill? no   Which pharmacy would you like this sent to? [facility] in [place]   Patient notified that their request is being sent to the clinical staff for review and that they should receive a response within 2 business days.

## 2021-08-08 ENCOUNTER — Ambulatory Visit: Payer: BC Managed Care – PPO | Admitting: Family

## 2021-08-08 ENCOUNTER — Encounter: Payer: Self-pay | Admitting: Family

## 2021-08-08 VITALS — BP 109/72 | HR 85 | Temp 98.0°F | Ht 74.0 in | Wt 197.6 lb

## 2021-08-08 DIAGNOSIS — E663 Overweight: Secondary | ICD-10-CM | POA: Diagnosis not present

## 2021-08-08 DIAGNOSIS — M1612 Unilateral primary osteoarthritis, left hip: Secondary | ICD-10-CM | POA: Diagnosis not present

## 2021-08-08 DIAGNOSIS — Z0000 Encounter for general adult medical examination without abnormal findings: Secondary | ICD-10-CM | POA: Diagnosis not present

## 2021-08-08 DIAGNOSIS — Z0001 Encounter for general adult medical examination with abnormal findings: Secondary | ICD-10-CM | POA: Diagnosis not present

## 2021-08-08 DIAGNOSIS — M545 Low back pain, unspecified: Secondary | ICD-10-CM | POA: Diagnosis not present

## 2021-08-08 DIAGNOSIS — G8929 Other chronic pain: Secondary | ICD-10-CM

## 2021-08-08 DIAGNOSIS — K219 Gastro-esophageal reflux disease without esophagitis: Secondary | ICD-10-CM | POA: Diagnosis not present

## 2021-08-08 MED ORDER — ESOMEPRAZOLE MAGNESIUM 40 MG PO CPDR: 40.0000 mg | DELAYED_RELEASE_CAPSULE | Freq: Every day | ORAL | 4 refills | Status: DC

## 2021-08-08 MED ORDER — MELOXICAM 15 MG PO TABS: 15.0000 mg | ORAL_TABLET | Freq: Every day | ORAL | 3 refills | Status: AC

## 2021-08-08 NOTE — Progress Notes (Signed)
Subjective:    Patient ID: Joel Snyder, male    DOB: [birth year], 61 y.o.   MRN: [id number]  Chief Complaint  Patient presents with   Medical Management of Chronic Issues   Back Pain    Lower back pain comes and goes been going on for years    PT presents to the office today for CPE.  Back Pain This is a chronic problem. The current episode started more than 1 year ago. The problem occurs intermittently. The problem has been waxing and waning since onset. The pain is present in the lumbar spine. The quality of the pain is described as shooting. The pain is at a severity of 5/10. The pain is moderate. He has tried NSAIDs for the symptoms. The treatment provided mild relief.  Gastroesophageal Reflux He complains of belching and heartburn. This is a chronic problem. The current episode started more than 1 year ago. The problem occurs occasionally. He has tried a PPI for the symptoms. The treatment provided moderate relief.  Arthritis Presents for follow-up visit. He complains of pain and stiffness. The symptoms have been stable. Affected locations include the left hip, left MCP and right MCP. His pain is at a severity of 5/10.      Review of Systems  Gastrointestinal:  Positive for heartburn.  Musculoskeletal:  Positive for arthritis, back pain and stiffness.  All other systems reviewed and are negative.  Family History  Problem Relation Age of Onset   Heart attack Father        4 heart attacks   Heart defect Father        open heart surgery and stent placement   Diabetes Mother    Cancer Paternal Grandfather    Alzheimer's disease Maternal Grandmother    Mental illness Maternal Grandmother    Heart attack Brother 1       stent placement   Social History   Socioeconomic History   Marital status: Married    Spouse name: Not on file   Number of children: Not on file   Years of education: Not on file   Highest education level: Not on file  Occupational History   Not on  file  Tobacco Use   Smoking status: Former    Packs/day: 1.00    Years: 12.00    Total pack years: 12.00    Types: Cigarettes    Start date: 10/06/1978    Quit date: 10/06/1990    Years since quitting: 30.8   Smokeless tobacco: Former  Scientific laboratory technician Use: Never used  Substance and Sexual Activity   Alcohol use: No    Alcohol/week: 0.0 standard drinks of alcohol   Drug use: No   Sexual activity: Not on file  Other Topics Concern   Not on file  Social History Narrative   Not on file   Social Determinants of Health   Financial Resource Strain: Not on file  Food Insecurity: Not on file  Transportation Needs: Not on file  Physical Activity: Not on file  Stress: Not on file  Social Connections: Not on file       Objective:   Physical Exam Vitals reviewed.  Constitutional:      General: He is not in acute distress.    Appearance: He is well-developed.  HENT:     Head: Normocephalic.     Right Ear: Tympanic membrane normal.     Left Ear: Tympanic membrane normal.  Eyes:     General:  Right eye: No discharge.        Left eye: No discharge.     Pupils: Pupils are equal, round, and reactive to light.  Neck:     Thyroid: No thyromegaly.  Cardiovascular:     Rate and Rhythm: Normal rate and regular rhythm.     Heart sounds: Normal heart sounds. No murmur heard. Pulmonary:     Effort: Pulmonary effort is normal. No respiratory distress.     Breath sounds: Normal breath sounds. No wheezing.  Abdominal:     General: Bowel sounds are normal. There is no distension.     Palpations: Abdomen is soft.     Tenderness: There is no abdominal tenderness.  Musculoskeletal:        General: No tenderness. Normal range of motion.     Cervical back: Normal range of motion and neck supple.  Skin:    General: Skin is warm and dry.     Findings: No erythema or rash.  Neurological:     Mental Status: He is alert and oriented to person, place, and time.     Cranial Nerves:  No cranial nerve deficit.     Deep Tendon Reflexes: Reflexes are normal and symmetric.  Psychiatric:        Behavior: Behavior normal.        Thought Content: Thought content normal.        Judgment: Judgment normal.     BP 109/72   Pulse 85   Temp 98 F (36.7 C) (Temporal)   Ht 6' 2" (1.88 m)   Wt 197 lb 9.6 oz (89.6 kg)   SpO2 97%   BMI 25.37 kg/m        Assessment & Plan:  Maximillion Gill comes in today with chief complaint of Medical Management of Chronic Issues and Back Pain (Lower back pain comes and goes been going on for years )   Diagnosis and orders addressed:  1. Gastroesophageal reflux disease without esophagitis - esomeprazole (NEXIUM) 40 MG capsule; Take 1 capsule (40 mg total) by mouth daily.  Dispense: 90 capsule; Refill: 4 - CMP14+EGFR - CBC with Differential/Platelet  2. Annual physical exam - CMP14+EGFR - CBC with Differential/Platelet - Lipid panel - PSA, total and free - TSH  3. Primary osteoarthritis of left hip - CMP14+EGFR - CBC with Differential/Platelet - meloxicam (MOBIC) 15 MG tablet; Take 1 tablet (15 mg total) by mouth daily.  Dispense: 90 tablet; Refill: 3  4. Chronic midline low back pain without sciatica  - CMP14+EGFR - CBC with Differential/Platelet - meloxicam (MOBIC) 15 MG tablet; Take 1 tablet (15 mg total) by mouth daily.  Dispense: 90 tablet; Refill: 3  5. Overweight (BMI 25.0-29.9) - CMP14+EGFR - CBC with Differential/Platelet   Labs pending Health Maintenance reviewed Diet and exercise encouraged  Follow up plan: 1 year   Evelina Dun, FNP

## 2021-08-08 NOTE — Patient Instructions (Signed)
Osteoarthritis  Osteoarthritis is a type of arthritis. It refers to joint pain or joint disease. Osteoarthritis affects tissue that covers the ends of bones in joints (cartilage). Cartilage acts as a cushion between the bones and helps them move smoothly. Osteoarthritis occurs when cartilage in the joints gets worn down. Osteoarthritis is sometimes called "wear and tear" arthritis. Osteoarthritis is the most common form of arthritis. It often occurs in older people. It is a condition that gets worse over time. The joints most often affected by this condition are in the fingers, toes, hips, knees, and spine, including the neck and lower back. What are the causes? This condition is caused by the wearing down of cartilage that covers the ends of bones. What increases the risk? The following factors may make you more likely to develop this condition: Being age 50 or older. Obesity. Overuse of joints. Past injury of a joint. Past surgery on a joint. Family history of osteoarthritis. What are the signs or symptoms? The main symptoms of this condition are pain, swelling, and stiffness in the joint. Other symptoms may include: An enlarged joint. More pain and further damage caused by small pieces of bone or cartilage that break off and float inside of the joint. Small deposits of bone (osteophytes) that grow on the edges of the joint. A grating or scraping feeling inside the joint when you move it. Popping or creaking sounds when you move. Difficulty walking or exercising. An inability to grip items, twist your hand(s), or control the movements of your hands and fingers. How is this diagnosed? This condition may be diagnosed based on: Your medical history. A physical exam. Your symptoms. X-rays of the affected joint(s). Blood tests to rule out other types of arthritis. How is this treated? There is no cure for this condition, but treatment can help control pain and improve joint function.  Treatment may include a combination of therapies, such as: Pain relief techniques, such as: Applying heat and cold to the joint. Massage. A form of talk therapy called cognitive behavioral therapy (CBT). This therapy helps you set goals and follow up on the changes that you make. Medicines for pain and inflammation. The medicines can be taken by mouth or applied to the skin. They include: NSAIDs, such as ibuprofen. Prescription medicines. Strong anti-inflammatory medicines (corticosteroids). Certain nutritional supplements. A prescribed exercise program. You may work with a physical therapist. Assistive devices, such as a brace, wrap, splint, specialized glove, or cane. A weight control plan. Surgery, such as: An osteotomy. This is done to reposition the bones and relieve pain or to remove loose pieces of bone and cartilage. Joint replacement surgery. You may need this surgery if you have advanced osteoarthritis. Follow these instructions at home: Activity Rest your affected joints as told by your health care provider. Exercise as told by your health care provider. He or she may recommend specific types of exercise, such as: Strengthening exercises. These are done to strengthen the muscles that support joints affected by arthritis. Aerobic activities. These are exercises, such as brisk walking or water aerobics, that increase your heart rate. Range-of-motion activities. These help your joints move more easily. Balance and agility exercises. Managing pain, stiffness, and swelling     If directed, apply heat to the affected area as often as told by your health care provider. Use the heat source that your health care provider recommends, such as a moist heat pack or a heating pad. If you have a removable assistive device, remove it   as told by your health care provider. Place a towel between your skin and the heat source. If your health care provider tells you to keep the assistive device  on while you apply heat, place a towel between the assistive device and the heat source. Leave the heat on for 20-30 minutes. Remove the heat if your skin turns bright red. This is especially important if you are unable to feel pain, heat, or cold. You may have a greater risk of getting burned. If directed, put ice on the affected area. To do this: If you have a removable assistive device, remove it as told by your health care provider. Put ice in a plastic bag. Place a towel between your skin and the bag. If your health care provider tells you to keep the assistive device on during icing, place a towel between the assistive device and the bag. Leave the ice on for 20 minutes, 2-3 times a day. Move your fingers or toes often to reduce stiffness and swelling. Raise (elevate) the injured area above the level of your heart while you are sitting or lying down. General instructions Take over-the-counter and prescription medicines only as told by your health care provider. Maintain a healthy weight. Follow instructions from your health care provider for weight control. Do not use any products that contain nicotine or tobacco, such as cigarettes, e-cigarettes, and chewing tobacco. If you need help quitting, ask your health care provider. Use assistive devices as told by your health care provider. Keep all follow-up visits as told by your health care provider. This is important. Where to find more information National Institute of Arthritis and Musculoskeletal and Skin Diseases: www.niams.nih.gov National Institute on Aging: www.nia.nih.gov American College of Rheumatology: www.rheumatology.org Contact a health care provider if: You have redness, swelling, or a feeling of warmth in a joint that gets worse. You have a fever along with joint or muscle aches. You develop a rash. You have trouble doing your normal activities. Get help right away if: You have pain that gets worse and is not relieved by  pain medicine. Summary Osteoarthritis is a type of arthritis that affects tissue covering the ends of bones in joints (cartilage). This condition is caused by the wearing down of cartilage that covers the ends of bones. The main symptom of this condition is pain, swelling, and stiffness in the joint. There is no cure for this condition, but treatment can help control pain and improve joint function. This information is not intended to replace advice given to you by your health care provider. Make sure you discuss any questions you have with your health care provider. Document Revised: 12/23/2018 Document Reviewed: 12/23/2018 Elsevier Patient Education  2023 Elsevier Inc.  

## 2021-08-09 LAB — CBC WITH DIFFERENTIAL/PLATELET
Basophils Absolute: 0.1 10*3/uL (ref 0.0–0.2)
Basos: 1 %
EOS (ABSOLUTE): 0.1 10*3/uL (ref 0.0–0.4)
Eos: 1 %
Hematocrit: 44.2 % (ref 37.5–51.0)
Hemoglobin: 14.8 g/dL (ref 13.0–17.7)
Immature Grans (Abs): 0 10*3/uL (ref 0.0–0.1)
Immature Granulocytes: 0 %
Lymphocytes Absolute: 1.6 10*3/uL (ref 0.7–3.1)
Lymphs: 27 %
MCH: 28.4 pg (ref 26.6–33.0)
MCHC: 33.5 g/dL (ref 31.5–35.7)
MCV: 85 fL (ref 79–97)
Monocytes Absolute: 0.7 10*3/uL (ref 0.1–0.9)
Monocytes: 11 %
Neutrophils Absolute: 3.7 10*3/uL (ref 1.4–7.0)
Neutrophils: 60 %
Platelets: 208 10*3/uL (ref 150–450)
RBC: 5.22 x10E6/uL (ref 4.14–5.80)
RDW: 12.8 % (ref 11.6–15.4)
WBC: 6.1 10*3/uL (ref 3.4–10.8)

## 2021-08-09 LAB — LIPID PANEL
Chol/HDL Ratio: 3.4 ratio (ref 0.0–5.0)
Cholesterol, Total: 235 mg/dL — ABNORMAL HIGH (ref 100–199)
HDL: 69 mg/dL (ref >39)
LDL Chol Calc (NIH): 134 mg/dL — ABNORMAL HIGH (ref 0–99)
Triglycerides: 180 mg/dL — ABNORMAL HIGH (ref 0–149)
VLDL Cholesterol Cal: 32 mg/dL (ref 5–40)

## 2021-08-09 LAB — CMP14+EGFR
ALT: 21 IU/L (ref 0–44)
AST: 22 IU/L (ref 0–40)
Albumin/Globulin Ratio: 1.8 (ref 1.2–2.2)
Albumin: 4.7 g/dL (ref 3.8–4.9)
Alkaline Phosphatase: 58 IU/L (ref 44–121)
BUN/Creatinine Ratio: 8 — ABNORMAL LOW (ref 9–20)
BUN: 9 mg/dL (ref 6–24)
Bilirubin Total: 0.3 mg/dL (ref 0.0–1.2)
CO2: 26 mmol/L (ref 20–29)
Calcium: 9.7 mg/dL (ref 8.7–10.2)
Chloride: 104 mmol/L (ref 96–106)
Creatinine, Ser: 1.16 mg/dL (ref 0.76–1.27)
Globulin, Total: 2.6 g/dL (ref 1.5–4.5)
Glucose: 94 mg/dL (ref 70–99)
Potassium: 5.2 mmol/L (ref 3.5–5.2)
Sodium: 144 mmol/L (ref 134–144)
Total Protein: 7.3 g/dL (ref 6.0–8.5)
eGFR: 73 mL/min/{1.73_m2} (ref >59)

## 2021-08-09 LAB — TSH: TSH: 2.3 u[IU]/mL (ref 0.450–4.500)

## 2021-08-09 LAB — PSA, TOTAL AND FREE
PSA, Free Pct: 34.4 %
PSA, Free: 0.31 ng/mL
Prostate Specific Ag, Serum: 0.9 ng/mL (ref 0.0–4.0)

## 2021-10-17 ENCOUNTER — Ambulatory Visit: Payer: BC Managed Care – PPO | Admitting: Family Medicine

## 2021-10-17 ENCOUNTER — Encounter: Payer: Self-pay | Admitting: Family Medicine

## 2021-10-17 ENCOUNTER — Ambulatory Visit (INDEPENDENT_AMBULATORY_CARE_PROVIDER_SITE_OTHER): Payer: BC Managed Care – PPO

## 2021-10-17 VITALS — BP 138/84 | HR 61 | Temp 97.0°F | Resp 20 | Ht 74.0 in | Wt 199.0 lb

## 2021-10-17 DIAGNOSIS — R109 Unspecified abdominal pain: Secondary | ICD-10-CM | POA: Diagnosis not present

## 2021-10-17 DIAGNOSIS — M1611 Unilateral primary osteoarthritis, right hip: Secondary | ICD-10-CM | POA: Diagnosis not present

## 2021-10-17 LAB — URINALYSIS
Bilirubin, UA: NEGATIVE
Glucose, UA: NEGATIVE
Ketones, UA: NEGATIVE
Leukocytes,UA: NEGATIVE
Nitrite, UA: NEGATIVE
Protein,UA: NEGATIVE
RBC, UA: NEGATIVE
Specific Gravity, UA: 1.01 (ref 1.005–1.030)
Urobilinogen, Ur: 0.2 mg/dL (ref 0.2–1.0)
pH, UA: 7 (ref 5.0–7.5)

## 2021-10-17 MED ORDER — TIZANIDINE HCL 4 MG PO TABS: 4.0000 mg | ORAL_TABLET | Freq: Four times a day (QID) | ORAL | 1 refills | Status: AC | PRN

## 2021-10-17 MED ORDER — PREDNISONE 10 MG PO TABS: ORAL_TABLET | ORAL | 0 refills | Status: DC

## 2021-10-17 NOTE — Progress Notes (Signed)
RLB pain. NKI  Subjective:  Patient ID: Joel Snyder, male    DOB: [birth year]  Age: 61 y.o. MRN: [id number]  CC: Back Pain   HPI Joel Snyder presents for RLB pain NKI. Onset last week. Sometimes occurs with the weather change. Unbearable as of two days ago. Radiating to inguinal area of RLQ. No nausea, No fever.      10/17/2021   10:52 AM 08/08/2021   11:51 AM 06/28/2020    2:37 PM  Depression screen PHQ 2/9  Decreased Interest 0 0 0  Down, Depressed, Hopeless 0 0 0  PHQ - 2 Score 0 0 0  Altered sleeping 0 0 0  Tired, decreased energy 0 0 0  Change in appetite 0 0 0  Feeling bad or failure about yourself  0 0 0  Trouble concentrating 0 0 0  Moving slowly or fidgety/restless 0 0 0  Suicidal thoughts 0 0 0  PHQ-9 Score 0 0 0  Difficult doing work/chores  Not difficult at all Not difficult at all    History Joel Snyder has a past medical history of Family history of heart disease and Palpitations.   He has no past surgical history on file.   His family history includes Alzheimer's disease in his maternal grandmother; Cancer in his paternal grandfather; Diabetes in his mother; Heart attack in his father; Heart attack (age of onset: 21) in his brother; Heart defect in his father; Mental illness in his maternal grandmother.He reports that he quit smoking about 31 years ago. His smoking use included cigarettes. He started smoking about 43 years ago. He has a 12.00 pack-year smoking history. He has quit using smokeless tobacco. He reports that he does not drink alcohol and does not use drugs.    ROS Review of Systems  Objective:  BP 138/84   Pulse 61   Temp (!) 97 F (36.1 C) (Oral)   Resp 20   Ht 6\' 2"  (1.88 m)   Wt 199 lb (90.3 kg)   SpO2 100%   BMI 25.55 kg/m   BP Readings from Last 3 Encounters:  10/17/21 138/84  08/08/21 109/72  06/28/20 127/84    Wt Readings from Last 3 Encounters:  10/17/21 199 lb (90.3 kg)  08/08/21 197 lb 9.6 oz (89.6 kg)  06/28/20 197 lb  6 oz (89.5 kg)     Physical Exam Vitals reviewed.  Constitutional:      Appearance: He is well-developed.  HENT:     Head: Normocephalic and atraumatic.     Right Ear: External ear normal.     Left Ear: External ear normal.     Mouth/Throat:     Pharynx: No oropharyngeal exudate or posterior oropharyngeal erythema.  Eyes:     Pupils: Pupils are equal, round, and reactive to light.  Cardiovascular:     Rate and Rhythm: Normal rate and regular rhythm.     Heart sounds: No murmur heard. Pulmonary:     Effort: No respiratory distress.     Breath sounds: Normal breath sounds.  Abdominal:     General: There is no distension.     Tenderness: There is no abdominal tenderness.  Musculoskeletal:        General: Tenderness (right inguinal region with 1 cm X 4 mm node . Minimal LB tenderness at the right L5 spinalis) present.     Cervical back: Normal range of motion and neck supple.  Neurological:     Mental Status: He is alert and oriented to person,  place, and time.       Assessment & Plan:   Joel Snyder was seen today for back pain.  Diagnoses and all orders for this visit:  Flank pain, acute -     Urinalysis -     Urine Culture -     DG HIP UNILAT W OR W/O PELVIS 2-3 VIEWS RIGHT; Future  Other orders -     predniSONE (DELTASONE) 10 MG tablet; Take 5 daily for 2 days followed by 4,3,2 and 1 for 2 days each. -     tiZANidine (ZANAFLEX) 4 MG tablet; Take 1 tablet (4 mg total) by mouth every 6 (six) hours as needed for muscle spasms.       I am having Joel Snyder start on predniSONE and tiZANidine. I am also having him maintain his multivitamin with minerals, tetrahydrozoline-zinc, aspirin EC, Multiple Vitamins-Minerals (AIRBORNE PO), esomeprazole, meloxicam, and acetaminophen.  Allergies as of 10/17/2021   No Known Allergies      Medication List        Accurate as of October 17, 2021 11:32 AM. If you have any questions, ask your nurse or doctor.           acetaminophen 500 MG tablet Commonly known as: TYLENOL Take 1,000 mg by mouth every 6 (six) hours as needed.   AIRBORNE PO Take by mouth.   aspirin EC 81 MG tablet Take 81 mg by mouth daily.   esomeprazole 40 MG capsule Commonly known as: NEXIUM Take 1 capsule (40 mg total) by mouth daily.   meloxicam 15 MG tablet Commonly known as: MOBIC Take 1 tablet (15 mg total) by mouth daily.   multivitamin with minerals Tabs tablet Take 1 tablet by mouth daily.   predniSONE 10 MG tablet Commonly known as: DELTASONE Take 5 daily for 2 days followed by 4,3,2 and 1 for 2 days each. Started by: Claretta Fraise, MD   tetrahydrozoline-zinc 0.05-0.25 % ophthalmic solution Commonly known as: VISINE-AC Place 2 drops into both eyes daily as needed (allergies).   tiZANidine 4 MG tablet Commonly known as: ZANAFLEX Take 1 tablet (4 mg total) by mouth every 6 (six) hours as needed for muscle spasms. Started by: Claretta Fraise, MD         Follow-up: Return if symptoms worsen or fail to improve.  Claretta Fraise, M.D.

## 2021-10-18 LAB — URINE CULTURE: Organism ID, Bacteria: NO GROWTH

## 2021-11-03 ENCOUNTER — Encounter: Payer: Self-pay | Admitting: Family

## 2021-11-03 ENCOUNTER — Ambulatory Visit: Payer: BC Managed Care – PPO | Admitting: Family

## 2021-11-03 VITALS — BP 132/86 | HR 95 | Temp 97.5°F | Ht 74.0 in | Wt 196.8 lb

## 2021-11-03 DIAGNOSIS — R1031 Right lower quadrant pain: Secondary | ICD-10-CM | POA: Diagnosis not present

## 2021-11-03 NOTE — Progress Notes (Signed)
Subjective:    Patient ID: Joel Snyder, male    DOB: 04/28/1960, 61 y.o.   MRN: [id number]  Chief Complaint  Patient presents with   Groin Pain    Patient was seen on 10/17/21 and the right flank pain has moved to groin area.  Also states right testicle is swollen.    PT presents to the office today with right groin pain that started earlier this month. He was seen on 10/17/21 with flank pain. He had an x-ray that showed arthritis. He was given prednisone and muscle relaxer that helped that pain.   He reports he is unsure if this is the same pain, but it has moved to his groin and has noticed swelling of his right testicular swelling for the last week. Denies any heavy lifting.  Groin Pain The patient's primary symptoms include scrotal swelling and testicular pain. The patient's pertinent negatives include no genital injury, genital itching, genital lesions, pelvic pain, penile discharge or penile pain. This is a new problem. The current episode started 1 to 4 weeks ago. The problem occurs constantly. The pain is moderate. Pertinent negatives include no urgency or urinary retention. The testicular pain affects the right testicle. There is swelling in the right testicle. The symptoms are aggravated by heavy lifting and activity. He has tried nothing for the symptoms.      Review of Systems  Genitourinary:  Positive for scrotal swelling and testicular pain. Negative for pelvic pain, penile discharge, penile pain and urgency.  All other systems reviewed and are negative.      Objective:   Physical Exam Vitals reviewed.  Constitutional:      General: He is not in acute distress.    Appearance: He is well-developed.  HENT:     Head: Normocephalic.  Eyes:     General:        Right eye: No discharge.        Left eye: No discharge.     Pupils: Pupils are equal, round, and reactive to light.  Neck:     Thyroid: No thyromegaly.  Cardiovascular:     Rate and Rhythm: Normal rate and  regular rhythm.     Heart sounds: Normal heart sounds. No murmur heard. Pulmonary:     Effort: Pulmonary effort is normal. No respiratory distress.     Breath sounds: Normal breath sounds. No wheezing.  Abdominal:     General: Bowel sounds are normal. There is no distension.     Palpations: Abdomen is soft.     Tenderness: There is no abdominal tenderness.  Genitourinary:      Comments: Mild right groin pain with palpation, no scrotum swelling noted, patient states it more swelling than usual.  Musculoskeletal:        General: Tenderness present. Normal range of motion.     Cervical back: Normal range of motion and neck supple.  Skin:    General: Skin is warm and dry.     Findings: No erythema or rash.  Neurological:     Mental Status: He is alert and oriented to person, place, and time.     Cranial Nerves: No cranial nerve deficit.     Deep Tendon Reflexes: Reflexes are normal and symmetric.  Psychiatric:        Behavior: Behavior normal.        Thought Content: Thought content normal.        Judgment: Judgment normal.      BP 132/86   Pulse  95   Temp (!) 97.5 F (36.4 C) (Temporal)   Ht 6\' 2"  (1.88 m)   Wt 196 lb 12.8 oz (89.3 kg)   SpO2 96%   BMI 25.27 kg/m       Assessment & Plan:   Atwood Adcock comes in today with chief complaint of Groin Pain (Patient was seen on 10/17/21 and the right flank pain has moved to groin area.  Also states right testicle is swollen. )   Diagnosis and orders addressed:  1. Right groin pain On exam no bulge seen. Will order [place] to rule out hernia vs groin strain.  Continue mobic Already taken prednisone with no relief to groin  Avoid heavy lifting  - US Abdomen Limited; Future - US SCROTUM; Future   Evelina Dun, FNP

## 2021-11-03 NOTE — Patient Instructions (Signed)
Inguinal Hernia, Adult An inguinal hernia develops when fat or the intestines push through a weak spot in a muscle where the leg meets the lower abdomen (groin). This creates a bulge. This kind of hernia could also be: In the scrotum, if you are male. In folds of skin around the vagina, if you are male. There are three types of inguinal hernias: Hernias that can be pushed back into the abdomen (are reducible). This type rarely causes pain. Hernias that are not reducible (are incarcerated). Hernias that are not reducible and lose their blood supply (are strangulated). This type of hernia requires emergency surgery. What are the causes? This condition is caused by having a weak spot in the muscles or tissues in your groin. This develops over time. The hernia may poke through the weak spot when you suddenly strain your lower abdominal muscles, such as when you: Lift a heavy object. Strain to have a bowel movement. Constipation can lead to straining. Cough. What increases the risk? This condition is more likely to develop in: Males. Pregnant females. People who: Are overweight. Work in jobs that require long periods of standing or heavy lifting. Have had an inguinal hernia before. Smoke or have lung disease. These factors can lead to long-term (chronic) coughing. What are the signs or symptoms? Symptoms may depend on the size of the hernia. Often, a small inguinal hernia has no symptoms. Symptoms of a larger hernia may include: A bulge in the groin area. This is easier to see when standing. It might not be visible when lying down. Pain or burning in the groin. This may get worse when lifting, straining, or coughing. A dull ache or a feeling of pressure in the groin. An unusual bulge in the scrotum, in males. Symptoms of a strangulated inguinal hernia may include: A bulge in your groin that is very painful and tender to the touch. A bulge that turns red or purple. Fever, nausea, and  vomiting. Inability to have a bowel movement or to pass gas. How is this diagnosed? This condition is diagnosed based on your symptoms, your medical history, and a physical exam. Your health care provider may feel your groin area and ask you to cough. How is this treated? Treatment depends on the size of your hernia and whether you have symptoms. If you do not have symptoms, your health care provider may have you watch your hernia carefully and have you come in for follow-up visits. If your hernia is large or if you have symptoms, you may need surgery to repair the hernia. Follow these instructions at home: Lifestyle Avoid lifting heavy objects. Avoid standing for long periods of time. Do not use any products that contain nicotine or tobacco. These products include cigarettes, chewing tobacco, and vaping devices, such as e-cigarettes. If you need help quitting, ask your health care provider. Maintain a healthy weight. Preventing constipation You may need to take these actions to prevent or treat constipation: Drink enough fluid to keep your urine pale yellow. Take over-the-counter or prescription medicines. Eat foods that are high in fiber, such as beans, whole grains, and fresh fruits and vegetables. Limit foods that are high in fat and processed sugars, such as fried or sweet foods. General instructions You may try to push the hernia back in place by very gently pressing on it while lying down. Do not try to force the bulge back in if it will not push in easily. Watch your hernia for any changes in shape, size, or   color. Get help right away if you notice any changes. Take over-the-counter and prescription medicines only as told by your health care provider. Keep all follow-up visits. This is important. Contact a health care provider if: You have a fever or chills. You develop new symptoms. Your symptoms get worse. Get help right away if: You have pain in your groin that suddenly gets  worse. You have a bulge in your groin that: Suddenly gets bigger and does not get smaller. Becomes red or purple or painful to the touch. You are a man and you have a sudden pain in your scrotum, or the size of your scrotum suddenly changes. You cannot push the hernia back in place by very gently pressing on it when you are lying down. You have nausea or vomiting that does not go away. You have a fast heartbeat. You cannot have a bowel movement or pass gas. These symptoms may represent a serious problem that is an emergency. Do not wait to see if the symptoms will go away. Get medical help right away. Call your local emergency services (911 in the [place] Summary An inguinal hernia develops when fat or the intestines push through a weak spot in a muscle where your leg meets your lower abdomen (groin). This condition is caused by having a weak spot in muscles or tissues in your groin. Symptoms may depend on the size of the hernia, and they may include pain or swelling in your groin. A small inguinal hernia often has no symptoms. Treatment may not be needed if you do not have symptoms. If you have symptoms or a large hernia, you may need surgery to repair the hernia. Avoid lifting heavy objects. Also, avoid standing for long periods of time. This information is not intended to replace advice given to you by your health care provider. Make sure you discuss any questions you have with your health care provider. Document Revised: 08/26/2019 Document Reviewed: 08/26/2019 Elsevier Patient Education  2023 [employer] Inc.  

## 2021-11-07 ENCOUNTER — Other Ambulatory Visit: Payer: Self-pay | Admitting: Family

## 2021-11-07 DIAGNOSIS — N5082 Scrotal pain: Secondary | ICD-10-CM

## 2021-11-07 DIAGNOSIS — N5089 Other specified disorders of the male genital organs: Secondary | ICD-10-CM

## 2021-11-07 DIAGNOSIS — R1031 Right lower quadrant pain: Secondary | ICD-10-CM

## 2021-11-09 ENCOUNTER — Other Ambulatory Visit: Payer: Self-pay

## 2021-11-09 ENCOUNTER — Emergency Department (HOSPITAL_COMMUNITY)
Admission: EM | Admit: 2021-11-09 | Discharge: 2021-11-09 | Disposition: A | Discharge status: Home / Self Care | Payer: BC Managed Care – PPO | Attending: Emergency Medicine | Admitting: Emergency Medicine

## 2021-11-09 ENCOUNTER — Encounter (HOSPITAL_COMMUNITY): Payer: Self-pay | Admitting: Emergency Medicine

## 2021-11-09 ENCOUNTER — Telehealth: Payer: Self-pay | Admitting: Family

## 2021-11-09 ENCOUNTER — Emergency Department (HOSPITAL_COMMUNITY): Payer: BC Managed Care – PPO

## 2021-11-09 DIAGNOSIS — N50811 Right testicular pain: Secondary | ICD-10-CM | POA: Insufficient documentation

## 2021-11-09 DIAGNOSIS — Z7982 Long term (current) use of aspirin: Secondary | ICD-10-CM | POA: Insufficient documentation

## 2021-11-09 DIAGNOSIS — R1031 Right lower quadrant pain: Secondary | ICD-10-CM | POA: Insufficient documentation

## 2021-11-09 DIAGNOSIS — N281 Cyst of kidney, acquired: Secondary | ICD-10-CM | POA: Diagnosis not present

## 2021-11-09 DIAGNOSIS — K760 Fatty (change of) liver, not elsewhere classified: Secondary | ICD-10-CM | POA: Diagnosis not present

## 2021-11-09 DIAGNOSIS — K4090 Unilateral inguinal hernia, without obstruction or gangrene, not specified as recurrent: Secondary | ICD-10-CM | POA: Diagnosis not present

## 2021-11-09 LAB — COMPREHENSIVE METABOLIC PANEL
ALT: 26 U/L (ref 0–44)
AST: 22 U/L (ref 15–41)
Albumin: 4.1 g/dL (ref 3.5–5.0)
Alkaline Phosphatase: 44 U/L (ref 38–126)
Anion gap: 6 (ref 5–15)
BUN: 13 mg/dL (ref 6–20)
CO2: 25 mmol/L (ref 22–32)
Calcium: 9 mg/dL (ref 8.9–10.3)
Chloride: 107 mmol/L (ref 98–111)
Creatinine, Ser: 1.16 mg/dL (ref 0.61–1.24)
GFR, Estimated: >60 mL/min (ref >60)
Glucose, Bld: 128 mg/dL — ABNORMAL HIGH (ref 70–99)
Potassium: 3.9 mmol/L (ref 3.5–5.1)
Sodium: 138 mmol/L (ref 135–145)
Total Bilirubin: 0.7 mg/dL (ref 0.3–1.2)
Total Protein: 7.3 g/dL (ref 6.5–8.1)

## 2021-11-09 LAB — CBC WITH DIFFERENTIAL/PLATELET
Abs Immature Granulocytes: 0.01 10*3/uL (ref 0.00–0.07)
Basophils Absolute: 0 10*3/uL (ref 0.0–0.1)
Basophils Relative: 1 %
Eosinophils Absolute: 0.2 10*3/uL (ref 0.0–0.5)
Eosinophils Relative: 3 %
HCT: 42.8 % (ref 39.0–52.0)
Hemoglobin: 14.2 g/dL (ref 13.0–17.0)
Immature Granulocytes: 0 %
Lymphocytes Relative: 27 %
Lymphs Abs: 1.4 10*3/uL (ref 0.7–4.0)
MCH: 28.9 pg (ref 26.0–34.0)
MCHC: 33.2 g/dL (ref 30.0–36.0)
MCV: 87.2 fL (ref 80.0–100.0)
Monocytes Absolute: 0.6 10*3/uL (ref 0.1–1.0)
Monocytes Relative: 12 %
Neutro Abs: 2.9 10*3/uL (ref 1.7–7.7)
Neutrophils Relative %: 57 %
Platelets: 186 10*3/uL (ref 150–400)
RBC: 4.91 MIL/uL (ref 4.22–5.81)
RDW: 13 % (ref 11.5–15.5)
WBC: 5.2 10*3/uL (ref 4.0–10.5)
nRBC: 0 % (ref 0.0–0.2)

## 2021-11-09 LAB — URINALYSIS, ROUTINE W REFLEX MICROSCOPIC
Bilirubin Urine: NEGATIVE
Glucose, UA: NEGATIVE mg/dL
Hgb urine dipstick: NEGATIVE
Ketones, ur: NEGATIVE mg/dL
Leukocytes,Ua: NEGATIVE
Nitrite: NEGATIVE
Protein, ur: NEGATIVE mg/dL
Specific Gravity, Urine: 1.028 (ref 1.005–1.030)
pH: 7 (ref 5.0–8.0)

## 2021-11-09 MED ORDER — HYDROMORPHONE HCL 1 MG/ML IJ SOLN
0.5000 mg | Freq: Once | INTRAMUSCULAR | Status: AC
  Administered 2021-11-09: 0.5 mg via INTRAVENOUS
  Filled 2021-11-09: qty 0.5

## 2021-11-09 MED ORDER — IOHEXOL 300 MG/ML  SOLN
100.0000 mL | Freq: Once | INTRAMUSCULAR | Status: AC | PRN
  Administered 2021-11-09: 100 mL via INTRAVENOUS

## 2021-11-09 MED ORDER — OXYCODONE-ACETAMINOPHEN 5-325 MG PO TABS: 1.0000 | ORAL_TABLET | Freq: Four times a day (QID) | ORAL | 0 refills | Status: DC | PRN

## 2021-11-09 NOTE — Telephone Encounter (Signed)
Patient calling because he is still having pain, said that the tiZANidine (ZANAFLEX) 4 MG tablet he was given does not seem to be helping him. Wants to know if there is anything else that he can do or take to help with the pain.

## 2021-11-09 NOTE — ED Notes (Signed)
Pt returned from CT °

## 2021-11-09 NOTE — Telephone Encounter (Signed)
I spoke to pt and he says he may have slept a little over an hour last night due to the constant pain. It has gotten worse and on a pain scale he says he is at 9 out of 10 on the pain scale so I advised pt at this point he should go to the ED for further evaluation and pt voiced understanding.

## 2021-11-09 NOTE — ED Triage Notes (Signed)
Pt c/o lower back pain x 3 weeks ago. Tx with steroids. States pain continued and moved to front groin area and radiates down. Pt has right testicle swelling per pt. Pain with urination

## 2021-11-09 NOTE — Discharge Instructions (Signed)
Follow-up next week with [facility].  Return if any problem

## 2021-11-09 NOTE — Progress Notes (Signed)
Pt to CT

## 2021-11-09 NOTE — ED Provider Notes (Signed)
[facility] EMERGENCY DEPARTMENT Provider Note   CSN: [id number] Arrival date & time: 11/09/21  1812     History  Chief Complaint  Patient presents with   Back Pain    Joel Snyder is a 61 y.o. male.  Patient complains of right groin pain and pain into his right testicle.  Patient has a history of GERD  The history is provided by the patient and medical records. No language interpreter was used.  Abdominal Pain Pain location:  RLQ Pain quality: aching   Pain radiation: Right testicle. Pain severity:  Mild Onset quality:  Gradual Timing:  Constant Progression:  Waxing and waning Chronicity:  New Context: not alcohol use   Associated symptoms: no chest pain, no cough, no diarrhea, no fatigue and no hematuria        Home Medications Prior to Admission medications   Medication Sig Start Date End Date Taking? Authorizing Provider  oxyCODONE-acetaminophen (PERCOCET/ROXICET) 5-325 MG tablet Take 1 tablet by mouth every 6 (six) hours as needed for severe pain. 11/09/21  Yes Milton Ferguson, MD  acetaminophen (TYLENOL) 500 MG tablet Take 1,000 mg by mouth every 6 (six) hours as needed.    [provider]  aspirin EC 81 MG tablet Take 81 mg by mouth daily.    [provider]  esomeprazole (NEXIUM) 40 MG capsule Take 1 capsule (40 mg total) by mouth daily. 08/08/21   Sharion Balloon, FNP  meloxicam (MOBIC) 15 MG tablet Take 1 tablet (15 mg total) by mouth daily. 08/08/21   Sharion Balloon, FNP  Multiple Vitamin (MULTIVITAMIN WITH MINERALS) TABS tablet Take 1 tablet by mouth daily.    [provider]  Multiple Vitamins-Minerals (AIRBORNE PO) Take by mouth.    [provider]  tetrahydrozoline-zinc (VISINE-AC) 0.05-0.25 % ophthalmic solution Place 2 drops into both eyes daily as needed (allergies).    [provider]  tiZANidine (ZANAFLEX) 4 MG tablet Take 1 tablet (4 mg total) by mouth every 6 (six) hours as needed for muscle spasms.  10/17/21   Claretta Fraise, MD      Allergies    Penicillins    Review of Systems   Review of Systems  Constitutional:  Negative for appetite change and fatigue.  HENT:  Negative for congestion, ear discharge and sinus pressure.   Eyes:  Negative for discharge.  Respiratory:  Negative for cough.   Cardiovascular:  Negative for chest pain.  Gastrointestinal:  Positive for abdominal pain. Negative for diarrhea.  Genitourinary:  Negative for frequency and hematuria.  Musculoskeletal:  Negative for back pain.  Skin:  Negative for rash.  Neurological:  Negative for seizures and headaches.  Psychiatric/Behavioral:  Negative for hallucinations.     Physical Exam Updated Vital Signs BP 127/89   Pulse (!) 106   Temp 98.1 F (36.7 C) (Oral)   Resp (!) 24   SpO2 99%  Physical Exam Vitals and nursing note reviewed.  Constitutional:      Appearance: He is well-developed.  HENT:     Head: Normocephalic.     Nose: Nose normal.  Eyes:     General: No scleral icterus.    Conjunctiva/sclera: Conjunctivae normal.  Neck:     Thyroid: No thyromegaly.  Cardiovascular:     Rate and Rhythm: Normal rate and regular rhythm.     Heart sounds: No murmur heard.    No friction rub. No gallop.  Pulmonary:     Breath sounds: No stridor. No wheezing or rales.  Chest:     Chest wall: No tenderness.  Abdominal:     General: There is no distension.     Tenderness: There is abdominal tenderness. There is no rebound.     Comments: Tender right inguinal area and right testicle  Musculoskeletal:        General: Normal range of motion.     Cervical back: Neck supple.  Lymphadenopathy:     Cervical: No cervical adenopathy.  Skin:    Findings: No erythema or rash.  Neurological:     Mental Status: He is alert and oriented to person, place, and time.     Motor: No abnormal muscle tone.     Coordination: Coordination normal.  Psychiatric:        Behavior: Behavior normal.     ED Results /  Procedures / Treatments   Labs (all labs ordered are listed, but only abnormal results are displayed) Labs Reviewed  COMPREHENSIVE METABOLIC PANEL - Abnormal; Notable for the following components:      Result Value   Glucose, Bld 128 (*)    All other components within normal limits  URINALYSIS, ROUTINE W REFLEX MICROSCOPIC - Abnormal; Notable for the following components:   Color, Urine STRAW (*)    All other components within normal limits  CBC WITH DIFFERENTIAL/PLATELET    EKG None  Radiology CT ABDOMEN PELVIS W CONTRAST  Result Date: 11/09/2021 CLINICAL DATA:  Right lower quadrant abdominal pain EXAM: CT ABDOMEN AND PELVIS WITH CONTRAST TECHNIQUE: Multidetector CT imaging of the abdomen and pelvis was performed using the standard protocol following bolus administration of intravenous contrast. RADIATION DOSE REDUCTION: This exam was performed according to the departmental dose-optimization program which includes automated exposure control, adjustment of the mA and/or kV according to patient size and/or use of iterative reconstruction technique. CONTRAST:  168mL OMNIPAQUE IOHEXOL 300 MG/ML  SOLN COMPARISON:  None Available. FINDINGS: Lower chest: No acute abnormality. Hepatobiliary: Mild hepatic steatosis. No enhancing intrahepatic mass. No intra or extrahepatic biliary ductal dilation. Gallbladder unremarkable. Pancreas: Unremarkable Spleen: Unremarkable Adrenals/Urinary Tract: The adrenal glands are unremarkable. The kidneys are unremarkable save for a simple cortical cyst within the interpolar region of the right kidney. No follow-up imaging is recommended for this lesion. The bladder is unremarkable. Stomach/Bowel: Stomach is within normal limits. Appendix is absent. No evidence of bowel wall thickening, distention, or inflammatory changes. Vascular/Lymphatic: No significant vascular findings are present. No enlarged abdominal or pelvic lymph nodes. Reproductive: Prostate is unremarkable.  Other: No abdominal wall hernia or abnormality. No abdominopelvic ascites. Musculoskeletal: No acute or significant osseous findings. IMPRESSION: 1. No acute intra-abdominal pathology identified. No definite radiographic explanation for the patient's reported symptoms. Appendix absent 2. Mild hepatic steatosis. Electronically Signed   By: Fidela Salisbury M.D.   On: 11/09/2021 19:43    Procedures Procedures    Medications Ordered in ED Medications  HYDROmorphone (DILAUDID) injection 0.5 mg (0.5 mg Intravenous Given 11/09/21 1844)  iohexol (OMNIPAQUE) 300 MG/ML solution 100 mL (100 mLs Intravenous Contrast Given 11/09/21 1936)    ED Course/ Medical Decision Making/ A&P  CT scan abdomen was unremarkable.                         Medical Decision Making Amount and/or Complexity of Data Reviewed Labs: ordered. Radiology: ordered.  Risk Prescription drug management.  This patient presents to the ED for concern of right inguinal pain, this involves an extensive number of treatment options, and is a  complaint that carries with it a high risk of complications and morbidity.  The differential diagnosis includes groin strain   Co morbidities that complicate the patient evaluation  GERD   Additional history obtained:  Additional history obtained from patient External records from outside source obtained and reviewed including hospital records   Lab Tests:  I Ordered, and personally interpreted labs.  The pertinent results include: CBC and chemistries unremarkable   Imaging Studies ordered:  I ordered imaging studies including CT abdomen I independently visualized and interpreted imaging which showed fatty liver I agree with the radiologist interpretation   Cardiac Monitoring: / EKG:  The patient was maintained on a cardiac monitor.  I personally viewed and interpreted the cardiac monitored which showed an underlying rhythm of: Normal sinus rhythm   Consultations Obtained:  No  consultant  Problem List / ED Course / Critical interventions / Medication management  Inguinal pain fatty liver and GERD Dilaudid for pain Reevaluation of the patient after these medicines showed that the patient improved I have reviewed the patients home medicines and have made adjustments as needed   Social Determinants of Health:  No   Test / Admission - Considered:  None  Patient with right inguinal pain and right testicle pain.  Possible inguinal hernia.  Patient will be referred to urology for the testicular pain and given pain medicine        Final Clinical Impression(s) / ED Diagnoses Final diagnoses:  Right inguinal pain  Testicular pain, right    Rx / DC Orders ED Discharge Orders          Ordered    oxyCODONE-acetaminophen (PERCOCET/ROXICET) 5-325 MG tablet  Every 6 hours PRN        11/09/21 2102              Milton Ferguson, MD 11/11/21 1531

## 2021-11-10 ENCOUNTER — Telehealth: Payer: Self-pay | Admitting: Family

## 2021-11-10 ENCOUNTER — Telehealth: Payer: Self-pay

## 2021-11-10 ENCOUNTER — Inpatient Hospital Stay: Payer: BC Managed Care – PPO | Admitting: Family Medicine

## 2021-11-10 NOTE — Telephone Encounter (Signed)
Transition Care Management Follow-up Telephone Call Date of discharge and from where: 11/09/2021 [facility] How have you been since you were released from the hospital? Patient is still having serve pains - he has appt with Urology in the morning 11/11/2021 Any questions or concerns? No  Items Reviewed: Did the pt receive and understand the discharge instructions provided? Yes  Medications obtained and verified? Yes  Other? Yes  Any new allergies since your discharge? No  Dietary orders reviewed? Yes Do you have support at home? Yes   Home Care and Equipment/Supplies: Were home health services ordered? not applicable If so, what is the name of the agency? [facility]   Has the agency set up a time to come to the patient's home? not applicable Were any new equipment or medical supplies ordered?  No What is the name of the medical supply agency? [facility]  Were you able to get the supplies/equipment? not applicable Do you have any questions related to the use of the equipment or supplies? No  Functional Questionnaire: (I = Independent and D = Dependent) ADLs: I  Bathing/Dressing- I  Meal Prep- I  Eating- I  Maintaining continence- I  Transferring/Ambulation- I  Managing Meds- I  Follow up appointments reviewed:  PCP Hospital f/u appt confirmed?  [facility] AT [facility] TIME AS HE HAS APPT WITH UROLOGY   Specialist Hospital f/u appt confirmed? Yes  Scheduled to see UROLOGY  on 11/11/2021 @ 11AM. Are transportation arrangements needed? No  If their condition worsens, is the pt aware to call PCP or go to the Emergency Dept.? Yes Was the patient provided with contact information for the PCP's office or ED? Yes Was to pt encouraged to call back with questions or concerns? Yes

## 2021-11-10 NOTE — Telephone Encounter (Signed)
Informed wife that Aly[facility] does not have any appt available today. She wants pt seen ASAP due to how much pain he is in and wants to know why he is hurting so bad.  ER prescribed pain medication that is not really helping. CT was neg and an u/s has been scheduled for Monday per wife but would like to see if it could be scheduled sooner.  Offered to schedule with on call provider but wife states that pt has seen [facility] in the past and would like to see him if Alyse Low is not available.  Appt scheduled for 80min this pm at 3:55. Wife aware to check in no later than 3:45pm

## 2021-11-11 ENCOUNTER — Ambulatory Visit (HOSPITAL_COMMUNITY)
Admission: RE | Admit: 2021-11-11 | Discharge: 2021-11-11 | Disposition: A | Discharge status: Home / Self Care | Payer: BC Managed Care – PPO | Source: Ambulatory Visit | Attending: Family | Admitting: Family

## 2021-11-11 ENCOUNTER — Encounter: Payer: Self-pay | Admitting: Urology

## 2021-11-11 ENCOUNTER — Ambulatory Visit (INDEPENDENT_AMBULATORY_CARE_PROVIDER_SITE_OTHER): Payer: BC Managed Care – PPO | Admitting: Urology

## 2021-11-11 VITALS — BP 136/92 | HR 73 | Ht 74.0 in | Wt 198.0 lb

## 2021-11-11 DIAGNOSIS — N433 Hydrocele, unspecified: Secondary | ICD-10-CM | POA: Diagnosis not present

## 2021-11-11 DIAGNOSIS — M545 Low back pain, unspecified: Secondary | ICD-10-CM | POA: Diagnosis not present

## 2021-11-11 DIAGNOSIS — N5082 Scrotal pain: Secondary | ICD-10-CM | POA: Insufficient documentation

## 2021-11-11 DIAGNOSIS — R1031 Right lower quadrant pain: Secondary | ICD-10-CM

## 2021-11-11 DIAGNOSIS — N5089 Other specified disorders of the male genital organs: Secondary | ICD-10-CM | POA: Insufficient documentation

## 2021-11-11 DIAGNOSIS — N451 Epididymitis: Secondary | ICD-10-CM

## 2021-11-11 LAB — URINALYSIS, ROUTINE W REFLEX MICROSCOPIC
Bilirubin, UA: NEGATIVE
Glucose, UA: NEGATIVE
Ketones, UA: NEGATIVE
Leukocytes,UA: NEGATIVE
Nitrite, UA: NEGATIVE
Protein,UA: NEGATIVE
RBC, UA: NEGATIVE
Specific Gravity, UA: <1.005 — ABNORMAL LOW (ref 1.005–1.030)
Urobilinogen, Ur: 0.2 mg/dL (ref 0.2–1.0)
pH, UA: 5.5 (ref 5.0–7.5)

## 2021-11-11 MED ORDER — DOXYCYCLINE HYCLATE 100 MG PO CAPS: 100.0000 mg | ORAL_CAPSULE | Freq: Two times a day (BID) | ORAL | 0 refills | Status: AC

## 2021-11-11 MED ORDER — KETOROLAC TROMETHAMINE 60 MG/2ML IM SOLN
60.0000 mg | Freq: Once | INTRAMUSCULAR | Status: AC
  Administered 2021-11-11: 60 mg via INTRAMUSCULAR

## 2021-11-11 NOTE — Progress Notes (Signed)
Assessment: 1. Groin pain, right   2. Epididymitis, right   3. Acute right-sided low back pain without sciatica     Plan: I reviewed the patient's chart including provider notes, imaging results, and lab results. I personally viewed the CT study from 11/09/2021 and the scrotal ultrasound from today.  Results noted below. His imaging and lab results are normal.  His exam is consistent with possible right epididymitis although, this would not explain all of his symptoms. Diagnosis and management of epididymitis discussed. Begin doxycycline 100 mg twice daily x10 days. Toradol 60 mg IM given today. Return to office in 3 weeks His back pain with involvement of the right lower extremity is likely musculoskeletal/neurologic in etiology.  I recommended that he contact his PCP to discuss further evaluation.  Chief Complaint:  Chief Complaint  Patient presents with   Groin Pain    History of Present Illness:  Joel Snyder is a 61 y.o. male who is seen in consultation from Jannifer Rodney A, FNP for evaluation of right groin and scrotal pain.  He initially presented in early October 2023 with right sided low back pain.  Urinalysis at that time was unremarkable.  Urine culture showed no growth.  He was again seen in late October 2023 with movement of the pain into the right groin and right scrotal area.  He reported scrotal swelling on the right.  He was also having pain in the right upper thigh area.  The symptoms were worsened with standing and decreased with sitting.  He reports pain at night making it difficult to sleep.  His pain worsened to the point that he was seen in the emergency room on 11/09/2021.  Urinalysis was negative.  CBC was within normal limits. CT the abdomen and pelvis with contrast showed a simple cyst within the right kidney, no stones or evidence of obstruction, normal-appearing bladder and prostate. Scrotal ultrasound from today shows a normal right testicle, normal left  testicle, normal epididymis bilaterally, small bilateral hydroceles, no evidence of varicocele. He reports lower urinary tract symptoms with occasional frequency, urgency, and nocturia x2.  He has noted some increase in nocturia likely due to his difficulty sleeping.  No dysuria or gross hematuria. IPSS = 10 today.  Past Medical History:  Past Medical History:  Diagnosis Date   Family history of heart disease    Palpitations     Past Surgical History:  No past surgical history on file.  Allergies:  Allergies  Allergen Reactions   Penicillins     Family History:  Family History  Problem Relation Age of Onset   Heart attack Father        4 heart attacks   Heart defect Father        open heart surgery and stent placement   Diabetes Mother    Cancer Paternal Grandfather    Alzheimer's disease Maternal Grandmother    Mental illness Maternal Grandmother    Heart attack Brother 27       stent placement    Social History:  Social History   Tobacco Use   Smoking status: Former    Packs/day: 1.00    Years: 12.00    Total pack years: 12.00    Types: Cigarettes    Start date: 10/06/1978    Quit date: 10/06/1990    Years since quitting: 31.1   Smokeless tobacco: Former  Building services engineer Use: Never used  Substance Use Topics   Alcohol use: No  Alcohol/week: 0.0 standard drinks of alcohol   Drug use: No    Review of symptoms:  Constitutional:  Negative for unexplained weight loss, night sweats, fever, chills ENT:  Negative for nose bleeds, sinus pain, painful swallowing CV:  Negative for chest pain, shortness of breath, exercise intolerance, palpitations, loss of consciousness Resp:  Negative for cough, wheezing, shortness of breath GI:  Negative for vomiting, diarrhea, bloody stools GU:  Positives noted in HPI; otherwise negative for gross hematuria, dysuria, urinary incontinence Neuro:  Negative for seizures, poor balance, limb weakness, slurred speech Psych:   Negative for lack of energy, depression, anxiety Endocrine:  Negative for polydipsia, polyuria, symptoms of hypoglycemia (dizziness, hunger, sweating) Hematologic:  Negative for anemia, purpura, petechia, prolonged or excessive bleeding, use of anticoagulants  Allergic:  Negative for difficulty breathing or choking as a result of exposure to anything; no shellfish allergy; no allergic response (rash/itch) to materials, foods  Physical exam: BP (!) 136/92   Pulse 73   Ht 6\' 2"  (1.88 m)   Wt 198 lb (89.8 kg)   BMI 25.42 kg/m  GENERAL APPEARANCE:  Well appearing, well developed, well nourished, NAD HEENT: Atraumatic, Normocephalic, oropharynx clear. NECK: Supple without lymphadenopathy or thyromegaly. LUNGS: Clear to auscultation bilaterally. HEART: Regular Rate and Rhythm without murmurs, gallops, or rubs. ABDOMEN: Soft, non-tender, No Masses. EXTREMITIES: Moves all extremities well.  Without clubbing, cyanosis, or edema. NEUROLOGIC:  Alert and oriented x 3, normal gait, CN II-XII grossly intact.  MENTAL STATUS:  Appropriate. BACK:  Non-tender to palpation.  No CVAT SKIN:  Warm, dry and intact.   GU: Penis:  circumcised Meatus: stenotic Scrotum: No erythema or edema; no lesions Testis: Right testicle without mass, nontender; left testicle without mass, nontender Epididymis: tenderness: right Prostate: 30 g, NT, no nodules Rectum: Normal tone,  no masses or tenderness   Results: U/A:  dipstick negative

## 2021-11-11 NOTE — Progress Notes (Signed)
   Medication: Toradol  Dose: 60mg  Location: right Lot: [id number] Exp: 03/2022  Patient tolerated well, no complications were noted  Performed by: Levi Aland, CMA

## 2021-11-14 ENCOUNTER — Ambulatory Visit (HOSPITAL_COMMUNITY): Payer: BC Managed Care – PPO

## 2021-11-17 DIAGNOSIS — S336XXA Sprain of sacroiliac joint, initial encounter: Secondary | ICD-10-CM | POA: Diagnosis not present

## 2021-11-21 DIAGNOSIS — S336XXA Sprain of sacroiliac joint, initial encounter: Secondary | ICD-10-CM | POA: Diagnosis not present

## 2021-11-24 DIAGNOSIS — S336XXA Sprain of sacroiliac joint, initial encounter: Secondary | ICD-10-CM | POA: Diagnosis not present

## 2021-11-28 DIAGNOSIS — S336XXA Sprain of sacroiliac joint, initial encounter: Secondary | ICD-10-CM | POA: Diagnosis not present

## 2021-12-07 ENCOUNTER — Ambulatory Visit: Payer: BC Managed Care – PPO | Admitting: Urology

## 2021-12-19 DIAGNOSIS — S336XXA Sprain of sacroiliac joint, initial encounter: Secondary | ICD-10-CM | POA: Diagnosis not present

## 2022-01-16 DIAGNOSIS — S336XXA Sprain of sacroiliac joint, initial encounter: Secondary | ICD-10-CM | POA: Diagnosis not present

## 2022-02-13 DIAGNOSIS — S336XXA Sprain of sacroiliac joint, initial encounter: Secondary | ICD-10-CM | POA: Diagnosis not present

## 2022-03-13 DIAGNOSIS — S336XXA Sprain of sacroiliac joint, initial encounter: Secondary | ICD-10-CM | POA: Diagnosis not present

## 2022-04-13 DIAGNOSIS — S336XXA Sprain of sacroiliac joint, initial encounter: Secondary | ICD-10-CM | POA: Diagnosis not present

## 2022-05-11 DIAGNOSIS — S336XXA Sprain of sacroiliac joint, initial encounter: Secondary | ICD-10-CM | POA: Diagnosis not present

## 2022-06-08 DIAGNOSIS — S336XXA Sprain of sacroiliac joint, initial encounter: Secondary | ICD-10-CM | POA: Diagnosis not present

## 2022-07-26 ENCOUNTER — Other Ambulatory Visit: Payer: Self-pay | Admitting: Family Medicine

## 2022-07-26 DIAGNOSIS — S336XXA Sprain of sacroiliac joint, initial encounter: Secondary | ICD-10-CM | POA: Diagnosis not present

## 2022-07-26 DIAGNOSIS — Z1211 Encounter for screening for malignant neoplasm of colon: Secondary | ICD-10-CM

## 2022-08-02 ENCOUNTER — Other Ambulatory Visit: Payer: Self-pay | Admitting: Family

## 2022-08-02 DIAGNOSIS — G8929 Other chronic pain: Secondary | ICD-10-CM

## 2022-08-02 DIAGNOSIS — M1612 Unilateral primary osteoarthritis, left hip: Secondary | ICD-10-CM

## 2022-08-10 DIAGNOSIS — Z1211 Encounter for screening for malignant neoplasm of colon: Secondary | ICD-10-CM | POA: Diagnosis not present

## 2022-08-23 DIAGNOSIS — S336XXA Sprain of sacroiliac joint, initial encounter: Secondary | ICD-10-CM | POA: Diagnosis not present

## 2022-09-20 DIAGNOSIS — S336XXA Sprain of sacroiliac joint, initial encounter: Secondary | ICD-10-CM | POA: Diagnosis not present

## 2022-10-03 ENCOUNTER — Ambulatory Visit: Payer: BC Managed Care – PPO | Admitting: Family

## 2022-10-03 ENCOUNTER — Encounter: Payer: Self-pay | Admitting: Family

## 2022-10-03 VITALS — BP 122/81 | HR 77 | Temp 97.8°F | Ht 74.0 in | Wt 194.8 lb

## 2022-10-03 DIAGNOSIS — M1612 Unilateral primary osteoarthritis, left hip: Secondary | ICD-10-CM

## 2022-10-03 DIAGNOSIS — M545 Low back pain, unspecified: Secondary | ICD-10-CM

## 2022-10-03 DIAGNOSIS — Z0001 Encounter for general adult medical examination with abnormal findings: Secondary | ICD-10-CM | POA: Diagnosis not present

## 2022-10-03 DIAGNOSIS — K219 Gastro-esophageal reflux disease without esophagitis: Secondary | ICD-10-CM

## 2022-10-03 DIAGNOSIS — G8929 Other chronic pain: Secondary | ICD-10-CM | POA: Diagnosis not present

## 2022-10-03 DIAGNOSIS — E663 Overweight: Secondary | ICD-10-CM | POA: Diagnosis not present

## 2022-10-03 DIAGNOSIS — Z0000 Encounter for general adult medical examination without abnormal findings: Secondary | ICD-10-CM | POA: Diagnosis not present

## 2022-10-03 NOTE — Progress Notes (Signed)
Subjective:    Patient ID: Joel Snyder, male    DOB: 1960-11-12, 62 y.o.   MRN: [id number]  Chief Complaint  Patient presents with   Annual Exam   PT presents to the office today for CPE.  Gastroesophageal Reflux He complains of belching, heartburn and a hoarse voice. This is a chronic problem. The current episode started more than 1 year ago. The problem occurs occasionally. He has tried a PPI for the symptoms. The treatment provided moderate relief.  Arthritis Presents for follow-up visit. He complains of pain and stiffness. Affected locations include the left hip. His pain is at a severity of 6/10.  Back Pain This is a chronic problem. The current episode started more than 1 year ago. The problem occurs intermittently. The problem has been waxing and waning since onset. The pain is present in the lumbar spine. The quality of the pain is described as aching. The pain is at a severity of 4/10. The pain is moderate. He has tried bed rest and NSAIDs for the symptoms. The treatment provided mild relief.       Review of Systems  HENT:  Positive for hoarse voice.   Gastrointestinal:  Positive for heartburn.  Musculoskeletal:  Positive for arthritis, back pain and stiffness.  All other systems reviewed and are negative.   Family History  Problem Relation Age of Onset   Heart attack Father        4 heart attacks   Heart defect Father        open heart surgery and stent placement   Diabetes Mother    Cancer Paternal Grandfather    Alzheimer's disease Maternal Grandmother    Mental illness Maternal Grandmother    Heart attack Brother 87       stent placement   Social History   Socioeconomic History   Marital status: Married    Spouse name: Not on file   Number of children: Not on file   Years of education: Not on file   Highest education level: Not on file  Occupational History   Not on file  Tobacco Use   Smoking status: Former    Current packs/day: 0.00    Average  packs/day: 1 pack/day for 12.0 years (12.0 ttl pk-yrs)    Types: Cigarettes    Start date: 10/06/1978    Quit date: 10/06/1990    Years since quitting: 32.0   Smokeless tobacco: Former  Building services engineer status: Never Used  Substance and Sexual Activity   Alcohol use: No    Alcohol/week: 0.0 standard drinks of alcohol   Drug use: No   Sexual activity: Not on file  Other Topics Concern   Not on file  Social History Narrative   Not on file   Social Determinants of Health   Financial Resource Strain: Not on file  Food Insecurity: Not on file  Transportation Needs: Not on file  Physical Activity: Not on file  Stress: Not on file  Social Connections: Not on file       Objective:   Physical Exam Vitals reviewed.  Constitutional:      General: He is not in acute distress.    Appearance: He is well-developed.  HENT:     Head: Normocephalic.     Right Ear: Tympanic membrane normal.     Left Ear: Tympanic membrane normal.  Eyes:     General:        Right eye: No discharge.  Left eye: No discharge.     Pupils: Pupils are equal, round, and reactive to light.  Neck:     Thyroid: No thyromegaly.  Cardiovascular:     Rate and Rhythm: Normal rate and regular rhythm.     Heart sounds: Normal heart sounds. No murmur heard. Pulmonary:     Effort: Pulmonary effort is normal. No respiratory distress.     Breath sounds: Normal breath sounds. No wheezing.  Abdominal:     General: Bowel sounds are normal. There is no distension.     Palpations: Abdomen is soft.     Tenderness: There is no abdominal tenderness.  Musculoskeletal:        General: No tenderness. Normal range of motion.     Cervical back: Normal range of motion and neck supple.  Skin:    General: Skin is warm and dry.     Findings: No erythema or rash.  Neurological:     Mental Status: He is alert and oriented to person, place, and time.     Cranial Nerves: No cranial nerve deficit.     Deep Tendon Reflexes:  Reflexes are normal and symmetric.  Psychiatric:        Behavior: Behavior normal.        Thought Content: Thought content normal.        Judgment: Judgment normal.       BP 122/81   Pulse 77   Temp 97.8 F (36.6 C) (Temporal)   Ht 6\' 2"  (1.88 m)   Wt 194 lb 12.8 oz (88.4 kg)   SpO2 97%   BMI 25.01 kg/m      Assessment & Plan:  [name] comes in today with chief complaint of Annual Exam   Diagnosis and orders addressed:  1. Annual physical exam - CBC with Differential/Platelet - CMP14+EGFR - Lipid panel - PR PSA SCREENING - TSH  2. Chronic midline low back pain without sciatica - CBC with Differential/Platelet - CMP14+EGFR  3. Gastroesophageal reflux disease without esophagitis - CBC with Differential/Platelet - CMP14+EGFR  4. Overweight (BMI 25.0-29.9) - CBC with Differential/Platelet - CMP14+EGFR  5. Primary osteoarthritis of left hip - CBC with Differential/Platelet - CMP14+EGFR   Labs pending Health Maintenance reviewed Diet and exercise encouraged  Follow up plan: 1 year    Jannifer Rodney, FNP

## 2022-10-03 NOTE — Patient Instructions (Signed)
Health Maintenance, Male Adopting a healthy lifestyle and getting preventive care are important in promoting health and wellness. Ask your health care provider about: The right schedule for you to have regular tests and exams. Things you can do on your own to prevent diseases and keep yourself healthy. What should I know about diet, weight, and exercise? Eat a healthy diet  Eat a diet that includes plenty of vegetables, fruits, low-fat dairy products, and lean protein. Do not eat a lot of foods that are high in solid fats, added sugars, or sodium. Maintain a healthy weight Body mass index (BMI) is a measurement that can be used to identify possible weight problems. It estimates body fat based on height and weight. Your health care provider can help determine your BMI and help you achieve or maintain a healthy weight. Get regular exercise Get regular exercise. This is one of the most important things you can do for your health. Most adults should: Exercise for at least 150 minutes each week. The exercise should increase your heart rate and make you sweat (moderate-intensity exercise). Do strengthening exercises at least twice a week. This is in addition to the moderate-intensity exercise. Spend less time sitting. Even light physical activity can be beneficial. Watch cholesterol and blood lipids Have your blood tested for lipids and cholesterol at 60 years of age, then have this test every 5 years. You may need to have your cholesterol levels checked more often if: Your lipid or cholesterol levels are high. You are older than 60 years of age. You are at high risk for heart disease. What should I know about cancer screening? Many types of cancers can be detected early and may often be prevented. Depending on your health history and family history, you may need to have cancer screening at various ages. This may include screening for: Colorectal cancer. Prostate cancer. Skin cancer. Lung  cancer. What should I know about heart disease, diabetes, and high blood pressure? Blood pressure and heart disease High blood pressure causes heart disease and increases the risk of stroke. This is more likely to develop in people who have high blood pressure readings or are overweight. Talk with your health care provider about your target blood pressure readings. Have your blood pressure checked: Every 3-5 years if you are 18-39 years of age. Every year if you are 40 years old or older. If you are between the ages of 65 and 75 and are a current or former smoker, ask your health care provider if you should have a one-time screening for abdominal aortic aneurysm (AAA). Diabetes Have regular diabetes screenings. This checks your fasting blood sugar level. Have the screening done: Once every three years after age 45 if you are at a normal weight and have a low risk for diabetes. More often and at a younger age if you are overweight or have a high risk for diabetes. What should I know about preventing infection? Hepatitis B If you have a higher risk for hepatitis B, you should be screened for this virus. Talk with your health care provider to find out if you are at risk for hepatitis B infection. Hepatitis C Blood testing is recommended for: Everyone born from 1945 through 1965. Anyone with known risk factors for hepatitis C. Sexually transmitted infections (STIs) You should be screened each year for STIs, including gonorrhea and chlamydia, if: You are sexually active and are younger than 60 years of age. You are older than 60 years of age and your   health care provider tells you that you are at risk for this type of infection. Your sexual activity has changed since you were last screened, and you are at increased risk for chlamydia or gonorrhea. Ask your health care provider if you are at risk. Ask your health care provider about whether you are at high risk for HIV. Your health care provider  may recommend a prescription medicine to help prevent HIV infection. If you choose to take medicine to prevent HIV, you should first get tested for HIV. You should then be tested every 3 months for as long as you are taking the medicine. Follow these instructions at home: Alcohol use Do not drink alcohol if your health care provider tells you not to drink. If you drink alcohol: Limit how much you have to 0-2 drinks a day. Know how much alcohol is in your drink. In the [place] one drink equals one 12 oz bottle of beer (355 mL), one 5 oz glass of wine (148 mL), or one 1 oz glass of hard liquor (44 mL). Lifestyle Do not use any products that contain nicotine or tobacco. These products include cigarettes, chewing tobacco, and vaping devices, such as e-cigarettes. If you need help quitting, ask your health care provider. Do not use street drugs. Do not share needles. Ask your health care provider for help if you need support or information about quitting drugs. General instructions Schedule regular health, dental, and eye exams. Stay current with your vaccines. Tell your health care provider if: You often feel depressed. You have ever been abused or do not feel safe at home. Summary Adopting a healthy lifestyle and getting preventive care are important in promoting health and wellness. Follow your health care provider's instructions about healthy diet, exercising, and getting tested or screened for diseases. Follow your health care provider's instructions on monitoring your cholesterol and blood pressure. This information is not intended to replace advice given to you by your health care provider. Make sure you discuss any questions you have with your health care provider. Document Revised: 05/17/2020 Document Reviewed: 05/17/2020 Elsevier Patient Education  2024 [employer]  

## 2022-10-04 LAB — CBC WITH DIFFERENTIAL/PLATELET
Basophils Absolute: 0 10*3/uL (ref 0.0–0.2)
Basos: 1 %
EOS (ABSOLUTE): 0.1 10*3/uL (ref 0.0–0.4)
Eos: 1 %
Hematocrit: 43.2 % (ref 37.5–51.0)
Hemoglobin: 14.4 g/dL (ref 13.0–17.7)
Immature Grans (Abs): 0 10*3/uL (ref 0.0–0.1)
Immature Granulocytes: 0 %
Lymphocytes Absolute: 1.7 10*3/uL (ref 0.7–3.1)
Lymphs: 26 %
MCH: 28.7 pg (ref 26.6–33.0)
MCHC: 33.3 g/dL (ref 31.5–35.7)
MCV: 86 fL (ref 79–97)
Monocytes Absolute: 0.6 10*3/uL (ref 0.1–0.9)
Monocytes: 10 %
Neutrophils Absolute: 4 10*3/uL (ref 1.4–7.0)
Neutrophils: 62 %
Platelets: 202 10*3/uL (ref 150–450)
RBC: 5.01 x10E6/uL (ref 4.14–5.80)
RDW: 13 % (ref 11.6–15.4)
WBC: 6.4 10*3/uL (ref 3.4–10.8)

## 2022-10-04 LAB — CMP14+EGFR
ALT: 22 IU/L (ref 0–44)
AST: 23 IU/L (ref 0–40)
Albumin: 4.5 g/dL (ref 3.8–4.9)
Alkaline Phosphatase: 56 IU/L (ref 44–121)
BUN/Creatinine Ratio: 8 — ABNORMAL LOW (ref 9–20)
BUN: 9 mg/dL (ref 6–24)
Bilirubin Total: 0.4 mg/dL (ref 0.0–1.2)
CO2: 24 mmol/L (ref 20–29)
Calcium: 9.3 mg/dL (ref 8.7–10.2)
Chloride: 102 mmol/L (ref 96–106)
Creatinine, Ser: 1.14 mg/dL (ref 0.76–1.27)
Globulin, Total: 2.4 g/dL (ref 1.5–4.5)
Glucose: 100 mg/dL — ABNORMAL HIGH (ref 70–99)
Potassium: 4.5 mmol/L (ref 3.5–5.2)
Sodium: 140 mmol/L (ref 134–144)
Total Protein: 6.9 g/dL (ref 6.0–8.5)
eGFR: 74 mL/min/{1.73_m2} (ref >59)

## 2022-10-04 LAB — LIPID PANEL
Chol/HDL Ratio: 3.3 ratio (ref 0.0–5.0)
Cholesterol, Total: 206 mg/dL — ABNORMAL HIGH (ref 100–199)
HDL: 63 mg/dL (ref >39)
LDL Chol Calc (NIH): 119 mg/dL — ABNORMAL HIGH (ref 0–99)
Triglycerides: 134 mg/dL (ref 0–149)
VLDL Cholesterol Cal: 24 mg/dL (ref 5–40)

## 2022-10-04 LAB — TSH: TSH: 1.35 u[IU]/mL (ref 0.450–4.500)

## 2022-10-18 DIAGNOSIS — S336XXA Sprain of sacroiliac joint, initial encounter: Secondary | ICD-10-CM | POA: Diagnosis not present

## 2022-10-27 ENCOUNTER — Other Ambulatory Visit: Payer: Self-pay | Admitting: Family

## 2022-10-27 DIAGNOSIS — K219 Gastro-esophageal reflux disease without esophagitis: Secondary | ICD-10-CM

## 2022-11-15 DIAGNOSIS — S336XXA Sprain of sacroiliac joint, initial encounter: Secondary | ICD-10-CM | POA: Diagnosis not present

## 2022-12-13 DIAGNOSIS — S336XXA Sprain of sacroiliac joint, initial encounter: Secondary | ICD-10-CM | POA: Diagnosis not present

## 2023-01-17 DIAGNOSIS — S336XXA Sprain of sacroiliac joint, initial encounter: Secondary | ICD-10-CM | POA: Diagnosis not present

## 2023-04-27 ENCOUNTER — Other Ambulatory Visit: Payer: Self-pay | Admitting: Family

## 2023-04-27 DIAGNOSIS — K219 Gastro-esophageal reflux disease without esophagitis: Secondary | ICD-10-CM

## 2023-07-04 ENCOUNTER — Ambulatory Visit: Admitting: Family Medicine

## 2023-07-05 ENCOUNTER — Ambulatory Visit: Admitting: Family

## 2023-07-05 ENCOUNTER — Other Ambulatory Visit (HOSPITAL_COMMUNITY)
Admission: RE | Admit: 2023-07-05 | Discharge: 2023-07-05 | Disposition: A | Discharge status: Home / Self Care | Source: Ambulatory Visit | Attending: Family | Admitting: Family

## 2023-07-05 ENCOUNTER — Encounter: Payer: Self-pay | Admitting: Family

## 2023-07-05 VITALS — BP 114/77 | HR 91 | Ht 74.0 in | Wt 195.0 lb

## 2023-07-05 DIAGNOSIS — C4441 Basal cell carcinoma of skin of scalp and neck: Secondary | ICD-10-CM | POA: Diagnosis not present

## 2023-07-05 DIAGNOSIS — L989 Disorder of the skin and subcutaneous tissue, unspecified: Secondary | ICD-10-CM | POA: Insufficient documentation

## 2023-07-05 DIAGNOSIS — H01004 Unspecified blepharitis left upper eyelid: Secondary | ICD-10-CM | POA: Diagnosis not present

## 2023-07-05 DIAGNOSIS — H01001 Unspecified blepharitis right upper eyelid: Secondary | ICD-10-CM | POA: Diagnosis not present

## 2023-07-05 DIAGNOSIS — K219 Gastro-esophageal reflux disease without esophagitis: Secondary | ICD-10-CM | POA: Diagnosis not present

## 2023-07-05 DIAGNOSIS — H01002 Unspecified blepharitis right lower eyelid: Secondary | ICD-10-CM | POA: Diagnosis not present

## 2023-07-05 DIAGNOSIS — H2513 Age-related nuclear cataract, bilateral: Secondary | ICD-10-CM | POA: Diagnosis not present

## 2023-07-05 MED ORDER — ESOMEPRAZOLE MAGNESIUM 40 MG PO CPDR: 40.0000 mg | DELAYED_RELEASE_CAPSULE | Freq: Every day | ORAL | 2 refills | Status: AC

## 2023-07-05 NOTE — Patient Instructions (Signed)
 Excision of Skin Lesions Excision of a skin lesion is the removal of a section of skin by making small incisions in the skin. Through this process, the lesion is completely removed. This procedure is often done to treat or prevent cancer or infection. It may also be done to improve cosmetic appearance. You may have this procedure to remove: Cancerous (malignant) growths, such as basal cell carcinoma, squamous cell carcinoma, or melanoma. Noncancerous (benign) growths, such as a cyst or lipoma. Growths, such as moles or skin tags, which may be removed for cosmetic reasons. Various excision or surgical techniques may be used depending on your condition, the location of the lesion, and your overall health. Tell your health care provider about: Any allergies you have. All medicines you are taking, including vitamins, herbs, eye drops, creams, and over-the-counter medicines. Any problems you or family members have had with anesthetic medicines. Any bleeding problems you have. Any surgeries you have had. Any medical conditions you have. Whether you are pregnant or may be pregnant. What are the risks? Generally, this is a safe procedure. However, problems may occur, including: Bleeding. Infection. Scarring. Recurrence of the cyst, lipoma, or cancer. Allergic reaction to anesthetics, surgical materials, or ointments. Damage to nerves, blood vessels, muscles, or other structures. What happens before the procedure? Medicines Ask your health care provider about: Changing or stopping your regular medicines. This is especially important if you are taking diabetes medicines or blood thinners. Taking medicines such as aspirin  and ibuprofen. These medicines can thin your blood. Do not take these medicines unless your health care provider tells you to take them. Taking over-the-counter medicines, vitamins, herbs, and supplements. General instructions Do not use any products that contain nicotine or  tobacco. These products include cigarettes, chewing tobacco, and vaping devices, such as e-cigarettes. If you need help quitting, ask your health care provider. Follow instructions from your health care provider about eating or drinking restrictions. Ask your health care provider: How your surgery site will be marked. What steps will be taken to help prevent infection. These steps may include: Removing hair at the surgery site. Washing skin with a germ-killing soap. Taking antibiotic medicine. Ask your health care provider if you will need someone to take you home from the hospital or clinic after the procedure. What happens during the procedure?  You will be given a medicine to numb the area (local anesthetic). Your health care provider will remove the lesions using one of the following excision techniques. Complete surgical excision. This procedure may be done to treat a cancerous growth or a noncancerous cyst or lesion. A small scalpel or scissors will be used to gently cut around and under the lesion until it is completely removed. If bleeding occurs, it will be stopped with a device that delivers heat (electrocautery). The edges of the wound may be stitched (sutured) together. A bandage (dressing) will be applied. Samples will be sent to a lab for testing. Excision of a cyst. An incision will be made on the cyst. The entire cyst will be removed through the incision. The incision may be closed with sutures. Shave excision. This may be done to remove a mole or other small growths. A small blade or scalpel will be used to shave off the lesion. The wound is usually left to heal on its own without sutures. The sample may be sent to a lab for testing. Punch excision. This may be done to completely remove a mole or other small growths. A small tool that  is like a cookie cutter or a hole punch is used to cut a circle shape out of the skin. The outer edges of the skin will be sutured  together. The sample may be sent to a lab for testing. Mohs micrographic surgery. This is usually done to treat skin cancer. This type of excision is mostly used on the face and ears. This procedure is minimally invasive, and it ensures the best cosmetic outcome. A scalpel or a loop instrument will be used to remove layers of the lesion until all the abnormal or cancerous tissue has been removed. The wound may be sutured, depending on its size. The tissue will be checked under a microscope right away. The procedure may vary among health care providers and hospitals. At the end of any of these procedures, antibiotic ointment will be applied as needed. What happens after the procedure? Talk with your health care provider to discuss any test results, treatment options, and if necessary, the need for more tests. Keep all follow-up visits. This is important. Summary Excision of a skin lesion is the removal of a section of skin by making small incisions in the skin. This procedure is often done to treat or prevent skin cancer, remove benign growths, or it may be done to improve cosmetic appearance. Various excision or surgical techniques may be used depending on your condition, the location of the lesion, and your overall health. After the procedure, talk with your health care provider to discuss any test results, treatment options, and if necessary, the need for more tests. Keep all follow-up visits. This is important. This information is not intended to replace advice given to you by your health care provider. Make sure you discuss any questions you have with your health care provider. Document Revised: 07/26/2020 Document Reviewed: 07/27/2020 Elsevier Patient Education  2025 [employer].

## 2023-07-05 NOTE — Progress Notes (Signed)
 Subjective:    Patient ID: Joel Snyder, male    DOB: 1960-09-17, 63 y.o.   MRN: [id number]  Chief Complaint  Patient presents with   nodule    Left post neck. Raised, enlarging, itching, tender   PT presents to the office today with a skin lesion left posterior neck that he noticed a year and half ago. Denies any pain unless he scratches or touches it. Denies any redness, discharge. States the area has become larger.  Gastroesophageal Reflux He complains of belching and heartburn. This is a chronic problem. The current episode started more than 1 year ago. The problem occurs occasionally. He has tried a PPI for the symptoms. The treatment provided moderate relief.     Review of Systems  Gastrointestinal:  Positive for heartburn.  All other systems reviewed and are negative.   Social History   Socioeconomic History   Marital status: Married    Spouse name: Not on file   Number of children: Not on file   Years of education: Not on file   Highest education level: Not on file  Occupational History   Not on file  Tobacco Use   Smoking status: Former    Current packs/day: 0.00    Average packs/day: 1 pack/day for 12.0 years (12.0 ttl pk-yrs)    Types: Cigarettes    Start date: 10/06/1978    Quit date: 10/06/1990    Years since quitting: 32.7   Smokeless tobacco: Former  Building services engineer status: Never Used  Substance and Sexual Activity   Alcohol use: No    Alcohol/week: 0.0 standard drinks of alcohol   Drug use: No   Sexual activity: Not on file  Other Topics Concern   Not on file  Social History Narrative   Not on file   Social Drivers of Health   Financial Resource Strain: Not on file  Food Insecurity: Not on file  Transportation Needs: Not on file  Physical Activity: Not on file  Stress: Not on file  Social Connections: Not on file   Family History  Problem Relation Age of Onset   Heart attack Father        4 heart attacks   Heart defect Father         open heart surgery and stent placement   Diabetes Mother    Cancer Paternal Grandfather    Alzheimer's disease Maternal Grandmother    Mental illness Maternal Grandmother    Heart attack Brother 35       stent placement        Objective:   Physical Exam Vitals reviewed.  Constitutional:      General: He is not in acute distress.    Appearance: He is well-developed.  HENT:     Head: Normocephalic.      Comments: Skin lesion approx 1X0.9 cm with   Eyes:     General:        Right eye: No discharge.        Left eye: No discharge.     Pupils: Pupils are equal, round, and reactive to light.   Neck:     Thyroid: No thyromegaly.   Cardiovascular:     Rate and Rhythm: Normal rate and regular rhythm.     Heart sounds: Normal heart sounds. No murmur heard. Pulmonary:     Effort: Pulmonary effort is normal. No respiratory distress.     Breath sounds: Normal breath sounds. No wheezing.  Abdominal:  General: Bowel sounds are normal. There is no distension.     Palpations: Abdomen is soft.     Tenderness: There is no abdominal tenderness.   Musculoskeletal:        General: No tenderness. Normal range of motion.     Cervical back: Normal range of motion and neck supple.   Skin:    General: Skin is warm and dry.     Findings: No erythema or rash.   Neurological:     Mental Status: He is alert and oriented to person, place, and time.     Cranial Nerves: No cranial nerve deficit.     Deep Tendon Reflexes: Reflexes are normal and symmetric.   Psychiatric:        Behavior: Behavior normal.        Thought Content: Thought content normal.        Judgment: Judgment normal.       BP 114/77   Pulse 91   Ht 6' 2 (1.88 m)   Wt 195 lb (88.5 kg)   SpO2 97%   BMI 25.04 kg/m      Assessment & Plan:  Joel Snyder comes in today with chief complaint of nodule (Left post neck. Raised, enlarging, itching, tender)   Diagnosis and orders addressed:  1. Skin lesion  (Primary) - Surgical pathology -Keep pressure dressing on for 24 hours  Worrisome for squamous cell vs basal  Will do referral to Derm if positive    2. Gastroesophageal reflux disease without esophagitis -Diet discussed- Avoid fried, spicy, citrus foods, caffeine and alcohol -Do not eat 2-3 hours before bedtime -Encouraged small frequent meals -Avoid NSAID's - esomeprazole  (NEXIUM ) 40 MG capsule; Take 1 capsule (40 mg total) by mouth daily.  Dispense: 90 capsule; Refill: 2    Bari Learn, FNP

## 2023-07-10 ENCOUNTER — Ambulatory Visit: Payer: Self-pay | Admitting: Family

## 2023-07-10 ENCOUNTER — Other Ambulatory Visit: Payer: Self-pay | Admitting: Family

## 2023-07-10 DIAGNOSIS — C4441 Basal cell carcinoma of skin of scalp and neck: Secondary | ICD-10-CM

## 2023-07-10 LAB — SURGICAL PATHOLOGY

## 2023-08-02 ENCOUNTER — Telehealth: Payer: Self-pay | Admitting: Family

## 2023-08-02 NOTE — Telephone Encounter (Unsigned)
 Copied from CRM #[id number]. Topic: Referral - Question >> Aug 02, 2023 10:57 AM Rosaria BRAVO wrote: Reason for CRM: Pt has questions about next steps regarding referral. Please advise   Best contact: [phone] (pt's wife Mrs. Franchot)

## 2023-08-03 NOTE — Telephone Encounter (Signed)
 R/C to # listed for Wife - LM at this time.  Referral sent to: [facility] - [facility] [address], [place] [address] [phone]

## 2023-08-13 DIAGNOSIS — H2512 Age-related nuclear cataract, left eye: Secondary | ICD-10-CM | POA: Diagnosis not present

## 2023-08-22 ENCOUNTER — Other Ambulatory Visit: Payer: Self-pay

## 2023-08-22 ENCOUNTER — Encounter (HOSPITAL_COMMUNITY)
Admission: RE | Admit: 2023-08-22 | Discharge: 2023-08-22 | Disposition: A | Discharge status: Home / Self Care | Source: Ambulatory Visit | Attending: Ophthalmology | Admitting: Ophthalmology

## 2023-08-22 ENCOUNTER — Encounter (HOSPITAL_COMMUNITY): Payer: Self-pay

## 2023-08-22 HISTORY — DX: Unspecified osteoarthritis, unspecified site: M19.90

## 2023-08-22 HISTORY — DX: Gastro-esophageal reflux disease without esophagitis: K21.9

## 2023-08-22 NOTE — H&P (Signed)
 Surgical History & Physical  Patient Name: Joel Snyder  DOB: [birth year]  Surgery: Cataract extraction with intraocular lens implant phacoemulsification; Left Eye Surgeon: Lynwood Hermann MD Surgery Date: 08/24/2023 Pre-Op Date: 07/05/2023  HPI: A 73 Yr. old male patient 1. The patient complains of difficulty when viewing TV, reading closed caption, news scrolls on TV, which began 10 years ago. Both eyes are affected. The episode is constant. The patient describes glare and hazy symptoms affecting their eyes/vision. The condition's severity increased since last visit. Symptoms occur when the patient is driving and reading. The symptom causes difficulty in driving due to glare from headlights or Sun. The condition is worse with daily activities. HPI Completed by Dr. Lynwood Hermann  Medical History: Cataracts Traumatic Iritis OD 2021, Presbyopia  GERD  Review of Systems Eyes cataracts Gastro GERD All recorded systems are negative except as noted above.  Social Never smoked Alcohol Never  Medication Prednisolone-moxiflox-bromfen,  Aspirin , Nexium   Sx/Procedures None  Drug Allergies  NKDA  History & Physical: Heent: cataracts NECK: supple without bruits LUNGS: lungs clear to auscultation CV: regular rate and rhythm Abdomen: soft and non-tender  Impression & Plan: Assessment: 1.  NUCLEAR SCLEROSIS AGE RELATED; Both Eyes (H25.13) 2.  BLEPHARITIS; Right Upper Lid, Right Lower Lid, Left Upper Lid, Left Lower Lid (H01.001, H01.002,H01.004,H01.005) 3.  DERMATOCHALASIS, no surgery; Right Upper Lid, Left Upper Lid (H02.831, H02.834) 4.  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  Cataract accounts for the patient's decreased vision. This visual impairment is not correctable with a tolerable change in glasses or contact lenses. Cataract surgery with an implantation of a new lens should significantly improve the visual and functional status of the patient.Discussed all  risks, benefits, alternatives, and potential complications. Discussed the procedures and recovery. Patient desires to have surgery. A-scan ordered and performed today for intra-ocular lens calculations. The surgery will be performed in order to improve vision for driving, reading, and for eye examinations. Recommend phacoemulsification with intra-ocular lens. Recommend Dextenza for post-operative pain and inflammation. History of refractive Surgery: none Use of Eye Pressure Lowering Drops: none Left Eye - non-dominant - first. Dilates well - shugarcaine or Lidocaine +Omidira by protocol Standard IOL.  2.  Recommend regular lid cleaning  3.  Asymptomatic, recommend observation for now. Findings, prognosis and treatment options reviewed.  4.  Preservative Free Artificial tears 1 drop 2-3x/day.

## 2023-08-24 ENCOUNTER — Encounter (HOSPITAL_COMMUNITY): Payer: Self-pay | Admitting: Ophthalmology

## 2023-08-24 ENCOUNTER — Ambulatory Visit (HOSPITAL_COMMUNITY)
Admission: RE | Admit: 2023-08-24 | Discharge: 2023-08-24 | Disposition: A | Discharge status: Home / Self Care | Attending: Ophthalmology | Admitting: Ophthalmology

## 2023-08-24 ENCOUNTER — Other Ambulatory Visit: Payer: Self-pay

## 2023-08-24 ENCOUNTER — Encounter (HOSPITAL_COMMUNITY)
Admission: RE | Disposition: A | Discharge status: Home / Self Care | Payer: Self-pay | Source: Home / Self Care | Attending: Ophthalmology

## 2023-08-24 ENCOUNTER — Ambulatory Visit (HOSPITAL_COMMUNITY): Admitting: Anesthesiology

## 2023-08-24 DIAGNOSIS — H02834 Dermatochalasis of left upper eyelid: Secondary | ICD-10-CM | POA: Diagnosis not present

## 2023-08-24 DIAGNOSIS — M1990 Unspecified osteoarthritis, unspecified site: Secondary | ICD-10-CM | POA: Insufficient documentation

## 2023-08-24 DIAGNOSIS — H2513 Age-related nuclear cataract, bilateral: Secondary | ICD-10-CM | POA: Diagnosis not present

## 2023-08-24 DIAGNOSIS — R002 Palpitations: Secondary | ICD-10-CM | POA: Insufficient documentation

## 2023-08-24 DIAGNOSIS — K219 Gastro-esophageal reflux disease without esophagitis: Secondary | ICD-10-CM | POA: Insufficient documentation

## 2023-08-24 DIAGNOSIS — H0100A Unspecified blepharitis right eye, upper and lower eyelids: Secondary | ICD-10-CM | POA: Insufficient documentation

## 2023-08-24 DIAGNOSIS — H02831 Dermatochalasis of right upper eyelid: Secondary | ICD-10-CM | POA: Diagnosis not present

## 2023-08-24 DIAGNOSIS — H11823 Conjunctivochalasis, bilateral: Secondary | ICD-10-CM | POA: Diagnosis not present

## 2023-08-24 DIAGNOSIS — Z87891 Personal history of nicotine dependence: Secondary | ICD-10-CM | POA: Diagnosis not present

## 2023-08-24 DIAGNOSIS — H2512 Age-related nuclear cataract, left eye: Secondary | ICD-10-CM | POA: Diagnosis not present

## 2023-08-24 DIAGNOSIS — H0100B Unspecified blepharitis left eye, upper and lower eyelids: Secondary | ICD-10-CM | POA: Insufficient documentation

## 2023-08-24 HISTORY — PX: CATARACT EXTRACTION W/PHACO: SHX586

## 2023-08-24 SURGERY — PHACOEMULSIFICATION, CATARACT, WITH IOL INSERTION
Anesthesia: Monitor Anesthesia Care | Site: Eye | Laterality: Left

## 2023-08-24 MED ORDER — LACTATED RINGERS IV SOLN: INTRAVENOUS | Status: DC

## 2023-08-24 MED ORDER — PHENYLEPHRINE HCL 2.5 % OP SOLN
1.0000 [drp] | OPHTHALMIC | Status: AC | PRN
  Administered 2023-08-24 (×3): 1 [drp] via OPHTHALMIC

## 2023-08-24 MED ORDER — SODIUM CHLORIDE 0.9% FLUSH
INTRAVENOUS | Status: DC | PRN
  Administered 2023-08-24: 10 mL via INTRAVENOUS

## 2023-08-24 MED ORDER — POVIDONE-IODINE 5 % OP SOLN
OPHTHALMIC | Status: DC | PRN
  Administered 2023-08-24: 1 via OPHTHALMIC

## 2023-08-24 MED ORDER — MIDAZOLAM HCL 2 MG/2ML IJ SOLN
INTRAMUSCULAR | Status: DC | PRN
  Administered 2023-08-24: 2 mg via INTRAVENOUS

## 2023-08-24 MED ORDER — LIDOCAINE HCL (PF) 1 % IJ SOLN
INTRAMUSCULAR | Status: DC | PRN
  Administered 2023-08-24: 1 mL

## 2023-08-24 MED ORDER — SODIUM HYALURONATE 10 MG/ML IO SOLUTION
PREFILLED_SYRINGE | INTRAOCULAR | Status: DC | PRN
  Administered 2023-08-24: .85 mL via INTRAOCULAR

## 2023-08-24 MED ORDER — SODIUM HYALURONATE 23MG/ML IO SOSY
PREFILLED_SYRINGE | INTRAOCULAR | Status: DC | PRN
  Administered 2023-08-24: .6 mL via INTRAOCULAR

## 2023-08-24 MED ORDER — TROPICAMIDE 1 % OP SOLN
1.0000 [drp] | OPHTHALMIC | Status: AC | PRN
  Administered 2023-08-24 (×3): 1 [drp] via OPHTHALMIC

## 2023-08-24 MED ORDER — STERILE WATER FOR IRRIGATION IR SOLN
Status: DC | PRN
  Administered 2023-08-24: 250 mL

## 2023-08-24 MED ORDER — BSS IO SOLN
INTRAOCULAR | Status: DC | PRN
  Administered 2023-08-24: 15 mL via INTRAOCULAR

## 2023-08-24 MED ORDER — TETRACAINE HCL 0.5 % OP SOLN
1.0000 [drp] | OPHTHALMIC | Status: AC | PRN
  Administered 2023-08-24 (×3): 1 [drp] via OPHTHALMIC

## 2023-08-24 MED ORDER — MIDAZOLAM HCL 2 MG/2ML IJ SOLN
INTRAMUSCULAR | Status: AC
  Filled 2023-08-24: qty 2

## 2023-08-24 MED ORDER — PHENYLEPHRINE-KETOROLAC 1-0.3 % IO SOLN
INTRAOCULAR | Status: DC | PRN
  Administered 2023-08-24: 500 mL via OPHTHALMIC

## 2023-08-24 MED ORDER — MOXIFLOXACIN HCL 5 MG/ML IO SOLN
INTRAOCULAR | Status: DC | PRN
  Administered 2023-08-24: .2 mL via INTRACAMERAL

## 2023-08-24 MED ORDER — LIDOCAINE HCL 3.5 % OP GEL
1.0000 | Freq: Once | OPHTHALMIC | Status: AC
  Administered 2023-08-24: 1 via OPHTHALMIC

## 2023-08-24 SURGICAL SUPPLY — 11 items
CLOTH BEACON ORANGE TIMEOUT ST (SAFETY) ×1 IMPLANT
EYE SHIELD UNIVERSAL CLEAR (GAUZE/BANDAGES/DRESSINGS) IMPLANT
FEE CATARACT SUITE SIGHTPATH (MISCELLANEOUS) ×1 IMPLANT
GLOVE BIOGEL PI IND STRL 7.0 (GLOVE) ×2 IMPLANT
LENS IOL TECNIS EYHANCE 22.0 (Intraocular Lens) IMPLANT
NDL HYPO 18GX1.5 BLUNT FILL (NEEDLE) ×1 IMPLANT
NEEDLE HYPO 18GX1.5 BLUNT FILL (NEEDLE) ×1 IMPLANT
PAD ARMBOARD POSITIONER FOAM (MISCELLANEOUS) ×1 IMPLANT
SYR TB 1ML LL NO SAFETY (SYRINGE) ×1 IMPLANT
TAPE SURG TRANSPORE 1 IN (GAUZE/BANDAGES/DRESSINGS) IMPLANT
WATER STERILE IRR 250ML POUR (IV SOLUTION) ×1 IMPLANT

## 2023-08-24 NOTE — Anesthesia Postprocedure Evaluation (Signed)
 Anesthesia Post Note  Patient: Joel Snyder  Procedure(s) Performed: PHACOEMULSIFICATION, CATARACT, WITH IOL INSERTION (Left: Eye)  Patient location during evaluation: Phase II Anesthesia Type: MAC Level of consciousness: awake and alert Pain management: pain level controlled Vital Signs Assessment: post-procedure vital signs reviewed and stable Respiratory status: spontaneous breathing, nonlabored ventilation and respiratory function stable Cardiovascular status: stable and blood pressure returned to baseline Postop Assessment: no apparent nausea or vomiting Anesthetic complications: no   There were no known notable events for this encounter.   Last Vitals:  Vitals:   08/24/23 1017 08/24/23 1128  BP: (!) 128/90 (!) 124/94  Pulse: 75 72  Resp: 16 19  Temp:  36.6 C  SpO2: 100% 100%    Last Pain:  Vitals:   08/24/23 1128  TempSrc: Oral  PainSc: 0-No pain                 Chiyoko Torrico L Casha Estupinan

## 2023-08-24 NOTE — Transfer of Care (Signed)
 Immediate Anesthesia Transfer of Care Note  Patient: Joel Snyder  Procedure(s) Performed: PHACOEMULSIFICATION, CATARACT, WITH IOL INSERTION (Left: Eye)  Patient Location: Short Stay  Anesthesia Type:MAC  Level of Consciousness: awake, alert , oriented, and patient cooperative  Airway & Oxygen Therapy: Patient Spontanous Breathing  Post-op Assessment: Report given to RN, Post -op Vital signs reviewed and stable, and Patient moving all extremities X 4  Post vital signs: Reviewed and stable  Last Vitals:  Vitals Value Taken Time  BP 124/94 08/24/23 11:28  Temp 36.6 C 08/24/23 11:28  Pulse 72 08/24/23 11:28  Resp 19 08/24/23 11:28  SpO2 100 % 08/24/23 11:28    Last Pain:  Vitals:   08/24/23 1128  TempSrc: Oral  PainSc: 0-No pain         Complications: No notable events documented.

## 2023-08-24 NOTE — Discharge Instructions (Addendum)
 Please discharge patient when stable, will follow up today with Dr. June Leap at the [facility] office immediately following discharge.  Leave shield in place until visit.  All paperwork with discharge instructions will be given at the office.  [facility] Address:  [address]  [place], [place] [address]

## 2023-08-24 NOTE — Anesthesia Preprocedure Evaluation (Addendum)
 Anesthesia Evaluation  Patient identified by MRN, date of birth, ID band Patient awake    Reviewed: Allergy & Precautions, H&P , NPO status , Patient's Chart, lab work & pertinent test results, reviewed documented beta blocker date and time   Airway Mallampati: II  TM Distance: >3 FB Neck ROM: full    Dental no notable dental hx. (+) Dental Advisory Given, Teeth Intact   Pulmonary former smoker   Pulmonary exam normal breath sounds clear to auscultation       Cardiovascular Exercise Tolerance: Good negative cardio ROS Normal cardiovascular exam Rhythm:regular Rate:Normal  palpitations   Neuro/Psych negative neurological ROS  negative psych ROS   GI/Hepatic Neg liver ROS,GERD  ,,  Endo/Other  negative endocrine ROS    Renal/GU negative Renal ROS  negative genitourinary   Musculoskeletal  (+) Arthritis , Osteoarthritis,    Abdominal   Peds  Hematology negative hematology ROS (+)   Anesthesia Other Findings   Reproductive/Obstetrics negative OB ROS                              Anesthesia Physical Anesthesia Plan  ASA: 2  Anesthesia Plan: MAC   Post-op Pain Management: Minimal or no pain anticipated   Induction:   PONV Risk Score and Plan: Midazolam   Airway Management Planned: Nasal Cannula and Natural Airway  Additional Equipment: None  Intra-op Plan:   Post-operative Plan:   Informed Consent: I have reviewed the patients History and Physical, chart, labs and discussed the procedure including the risks, benefits and alternatives for the proposed anesthesia with the patient or authorized representative who has indicated his/her understanding and acceptance.     Dental Advisory Given  Plan Discussed with: CRNA  Anesthesia Plan Comments:          Anesthesia Quick Evaluation

## 2023-08-24 NOTE — Op Note (Signed)
 Date of procedure: 08/24/23  Pre-operative diagnosis: Visually significant age-related nuclear cataract, Left Eye (H25.12)  Post-operative diagnosis: Visually significant age-related nuclear cataract, Left Eye  Procedure: Removal of cataract via phacoemulsification and insertion of intra-ocular lens Johnson and Johnson DIB00 +22.0D into the capsular bag of the Left Eye  Attending surgeon: Lynwood LABOR. December Hedtke, MD, MA  Anesthesia: MAC, Topical Akten   Complications: None  Estimated Blood Loss: <40mL (minimal)  Specimens: None  Implants: As above  Indications:  Visually significant age-related cataract, Left Eye  Procedure:  The patient was seen and identified in the pre-operative area. The operative eye was identified and dilated.  The operative eye was marked.  Topical anesthesia was administered to the operative eye.     The patient was then to the operative suite and placed in the supine position.  A timeout was performed confirming the patient, procedure to be performed, and all other relevant information.   The patient's face was prepped and draped in the usual fashion for intra-ocular surgery.  A lid speculum was placed into the operative eye and the surgical microscope moved into place and focused.  An inferotemporal paracentesis was created using a 20 gauge paracentesis blade. Omidria  was injected into the anterior chamber. Shugarcaine was injected into the anterior chamber.  Viscoelastic was injected into the anterior chamber.  A temporal clear-corneal main wound incision was created using a 2.28mm microkeratome.  A continuous curvilinear capsulorrhexis was initiated using an irrigating cystitome and completed using capsulorrhexis forceps.  Hydrodissection and hydrodeliniation were performed.  Viscoelastic was injected into the anterior chamber.  A phacoemulsification handpiece and a chopper as a second instrument were used to remove the nucleus and epinucleus. The irrigation/aspiration  handpiece was used to remove any remaining cortical material.   The capsular bag was reinflated with viscoelastic, checked, and found to be intact.  The intraocular lens was inserted into the capsular bag.  The irrigation/aspiration handpiece was used to remove any remaining viscoelastic.  The clear corneal wound and paracentesis wounds were then hydrated and checked with Weck-Cels to be watertight. 0.1mL of moxifloxacin  was injected into the anterior chamber. The lid-speculum and drape was removed, and the patient's face was cleaned with a wet and dry 4x4. A clear shield was taped over the eye. The patient was taken to the post-operative care unit in good condition, having tolerated the procedure well.  Post-Op Instructions: The patient will follow up at [facility] for a same day post-operative evaluation and will receive all other orders and instructions.

## 2023-08-24 NOTE — Interval H&P Note (Signed)
 History and Physical Interval Note:  08/24/2023 11:02 AM  Joel Snyder  has presented today for surgery, with the diagnosis of nuclear sclerotic cataract, left eye.  The various methods of treatment have been discussed with the patient and family. After consideration of risks, benefits and other options for treatment, the patient has consented to  Procedure(s): PHACOEMULSIFICATION, CATARACT, WITH IOL INSERTION (Left) as a surgical intervention.  The patient's history has been reviewed, patient examined, no change in status, stable for surgery.  I have reviewed the patient's chart and labs.  Questions were answered to the patient's satisfaction.     HARRIE AGENT

## 2023-08-27 ENCOUNTER — Encounter (HOSPITAL_COMMUNITY): Payer: Self-pay | Admitting: Ophthalmology

## 2023-08-27 DIAGNOSIS — D225 Melanocytic nevi of trunk: Secondary | ICD-10-CM | POA: Diagnosis not present

## 2023-08-27 DIAGNOSIS — Z08 Encounter for follow-up examination after completed treatment for malignant neoplasm: Secondary | ICD-10-CM | POA: Diagnosis not present

## 2023-08-27 DIAGNOSIS — C44519 Basal cell carcinoma of skin of other part of trunk: Secondary | ICD-10-CM | POA: Diagnosis not present

## 2023-08-27 DIAGNOSIS — Z1283 Encounter for screening for malignant neoplasm of skin: Secondary | ICD-10-CM | POA: Diagnosis not present

## 2023-08-27 DIAGNOSIS — Z85828 Personal history of other malignant neoplasm of skin: Secondary | ICD-10-CM | POA: Diagnosis not present

## 2023-08-31 ENCOUNTER — Encounter (HOSPITAL_COMMUNITY)

## 2023-09-24 DIAGNOSIS — H2511 Age-related nuclear cataract, right eye: Secondary | ICD-10-CM | POA: Diagnosis not present

## 2023-09-25 ENCOUNTER — Encounter (HOSPITAL_COMMUNITY)
Admission: RE | Admit: 2023-09-25 | Discharge: 2023-09-25 | Disposition: A | Discharge status: Home / Self Care | Source: Ambulatory Visit | Attending: Ophthalmology | Admitting: Ophthalmology

## 2023-09-26 ENCOUNTER — Encounter (HOSPITAL_COMMUNITY): Payer: Self-pay | Admitting: Ophthalmology

## 2023-09-26 NOTE — H&P (Signed)
 Surgical History & Physical  Patient Name: Joel Snyder  DOB: 1960-04-12  Surgery: Cataract extraction with intraocular lens implant phacoemulsification; Right Eye Surgeon: Lynwood Hermann MD Surgery Date: 10/01/2023 Pre-Op Date: 09/20/2023  HPI: A 25 Yr. old male patient 1. The patient is returning after cataract surgery. The left eye is affected. Status post cataract surgery, which began 3 weeks ago: Since the last visit, the affected area feels improvement. The patient's vision is improved. The condition's severity is constant. Patient is following medication instructions.   2. The patient is returning for a cataract follow-up of the right eye. Since the last visit, the affected area is tolerating. The patient's vision is blurry. The condition's severity is constant. Patient is not taking medications. This is negatively affecting the patient's quality of life and the patient is unable to function adequately in life with the current level of vision. HPI Completed by Dr. Lynwood Hermann  Medical History: Cataracts Traumatic Iritis OD 2021, Presbyopia  GERD All recorded systems are negative except as noted above.  Social Never smoked Alcohol Never  Medication Prednisolone-moxiflox-bromfen, Prednisolone-moxiflox-bromfen,  Aspirin , Nexium    Sx/Procedures Phaco c IOL OS   Drug Allergies  NKDA  History & Physical: Heent: cataract NECK: supple without bruits LUNGS: lungs clear to auscultation CV: regular rate and rhythm Abdomen: soft and non-tender  Impression & Plan: Assessment: 1.  CATARACT EXTRACTION STATUS; Left Eye (Z98.42) 2.  NUCLEAR SCLEROSIS AGE RELATED; , Right Eye (H25.11) 3.  PCO; Left Eye (H26.492)  Plan: 1.  1 month after Phaco PCIOL. Doing very well with improved vision and normal IOP. Stop all post-operative medications. Call with any concerning symptoms.  2.  Cataract accounts for the patient's decreased vision. This visual impairment is not correctable with  a tolerable change in glasses or contact lenses. Cataract surgery with an implantation of a new lens should significantly improve the visual and functional status of the patient.Discussed all risks, benefits, alternatives, and potential complications. Discussed the procedures and recovery. Patient desires to have surgery. A-scan ordered and performed today for intra-ocular lens calculations. The surgery will be performed in order to improve vision for driving, reading, and for eye examinations. Recommend phacoemulsification with intra-ocular lens. Recommend Dextenza for post-operative pain and inflammation. History of refractive Surgery: none Use of Eye Pressure Lowering Drops: none Right eye. Dilates well - shugarcaine or Lidocaine +Omidira by protocol Standard IOL. Right Eye.  3.  Asymptomatic. Findings, prognosis and treatment options reviewed. No indication for laser at this point, will observe for changes.

## 2023-10-01 ENCOUNTER — Encounter (HOSPITAL_COMMUNITY): Payer: Self-pay | Admitting: Ophthalmology

## 2023-10-01 ENCOUNTER — Ambulatory Visit (HOSPITAL_COMMUNITY): Admitting: Anesthesiology

## 2023-10-01 ENCOUNTER — Ambulatory Visit (HOSPITAL_COMMUNITY)
Admission: RE | Admit: 2023-10-01 | Discharge: 2023-10-01 | Disposition: A | Discharge status: Home / Self Care | Attending: Ophthalmology | Admitting: Ophthalmology

## 2023-10-01 ENCOUNTER — Encounter (HOSPITAL_COMMUNITY)
Admission: RE | Disposition: A | Discharge status: Home / Self Care | Payer: Self-pay | Source: Home / Self Care | Attending: Ophthalmology

## 2023-10-01 DIAGNOSIS — Z87891 Personal history of nicotine dependence: Secondary | ICD-10-CM | POA: Diagnosis not present

## 2023-10-01 DIAGNOSIS — H2511 Age-related nuclear cataract, right eye: Secondary | ICD-10-CM | POA: Diagnosis not present

## 2023-10-01 DIAGNOSIS — K219 Gastro-esophageal reflux disease without esophagitis: Secondary | ICD-10-CM | POA: Diagnosis not present

## 2023-10-01 HISTORY — PX: CATARACT EXTRACTION W/PHACO: SHX586

## 2023-10-01 SURGERY — PHACOEMULSIFICATION, CATARACT, WITH IOL INSERTION
Anesthesia: Monitor Anesthesia Care | Site: Eye | Laterality: Right

## 2023-10-01 MED ORDER — SODIUM HYALURONATE 23MG/ML IO SOSY
PREFILLED_SYRINGE | INTRAOCULAR | Status: DC | PRN
  Administered 2023-10-01: .6 mL via INTRAOCULAR

## 2023-10-01 MED ORDER — STERILE WATER FOR IRRIGATION IR SOLN
Status: DC | PRN
  Administered 2023-10-01: 250 mL

## 2023-10-01 MED ORDER — MIDAZOLAM HCL 2 MG/2ML IJ SOLN
INTRAMUSCULAR | Status: AC
  Filled 2023-10-01: qty 2

## 2023-10-01 MED ORDER — MIDAZOLAM HCL 5 MG/5ML IJ SOLN
INTRAMUSCULAR | Status: DC | PRN
  Administered 2023-10-01: 2 mg via INTRAVENOUS

## 2023-10-01 MED ORDER — PHENYLEPHRINE-KETOROLAC 1-0.3 % IO SOLN
INTRAOCULAR | Status: DC | PRN
  Administered 2023-10-01: 500 mL via OPHTHALMIC

## 2023-10-01 MED ORDER — TETRACAINE HCL 0.5 % OP SOLN
1.0000 [drp] | OPHTHALMIC | Status: AC | PRN
  Administered 2023-10-01 (×3): 1 [drp] via OPHTHALMIC

## 2023-10-01 MED ORDER — POVIDONE-IODINE 5 % OP SOLN
OPHTHALMIC | Status: DC | PRN
  Administered 2023-10-01: 1 via OPHTHALMIC

## 2023-10-01 MED ORDER — TROPICAMIDE 1 % OP SOLN
1.0000 [drp] | OPHTHALMIC | Status: AC | PRN
  Administered 2023-10-01 (×3): 1 [drp] via OPHTHALMIC

## 2023-10-01 MED ORDER — PHENYLEPHRINE HCL 2.5 % OP SOLN
1.0000 [drp] | OPHTHALMIC | Status: AC | PRN
  Administered 2023-10-01 (×3): 1 [drp] via OPHTHALMIC

## 2023-10-01 MED ORDER — SODIUM CHLORIDE 0.9% FLUSH
INTRAVENOUS | Status: DC | PRN
  Administered 2023-10-01: 5 mL via INTRAVENOUS

## 2023-10-01 MED ORDER — LIDOCAINE HCL 3.5 % OP GEL
1.0000 | Freq: Once | OPHTHALMIC | Status: AC
  Administered 2023-10-01: 1 via OPHTHALMIC

## 2023-10-01 MED ORDER — LIDOCAINE HCL (PF) 1 % IJ SOLN
INTRAMUSCULAR | Status: DC | PRN
  Administered 2023-10-01: 2 mL

## 2023-10-01 MED ORDER — MOXIFLOXACIN HCL 5 MG/ML IO SOLN
INTRAOCULAR | Status: DC | PRN
  Administered 2023-10-01: .2 mL via INTRACAMERAL

## 2023-10-01 MED ORDER — SODIUM HYALURONATE 10 MG/ML IO SOLUTION
PREFILLED_SYRINGE | INTRAOCULAR | Status: DC | PRN
  Administered 2023-10-01: .85 mL via INTRAOCULAR

## 2023-10-01 MED ORDER — BSS IO SOLN
INTRAOCULAR | Status: DC | PRN
  Administered 2023-10-01: 15 mL via INTRAOCULAR

## 2023-10-01 SURGICAL SUPPLY — 11 items
CLOTH BEACON ORANGE TIMEOUT ST (SAFETY) ×1 IMPLANT
EYE SHIELD UNIVERSAL CLEAR (GAUZE/BANDAGES/DRESSINGS) IMPLANT
FEE CATARACT SUITE SIGHTPATH (MISCELLANEOUS) ×1 IMPLANT
GLOVE BIOGEL PI IND STRL 7.0 (GLOVE) ×2 IMPLANT
LENS IOL TECNIS EYHANCE 21.5 (Intraocular Lens) IMPLANT
NDL HYPO 18GX1.5 BLUNT FILL (NEEDLE) ×1 IMPLANT
NEEDLE HYPO 18GX1.5 BLUNT FILL (NEEDLE) ×1 IMPLANT
PAD ARMBOARD POSITIONER FOAM (MISCELLANEOUS) ×1 IMPLANT
SYR TB 1ML LL NO SAFETY (SYRINGE) ×1 IMPLANT
TAPE SURG TRANSPORE 1 IN (GAUZE/BANDAGES/DRESSINGS) IMPLANT
WATER STERILE IRR 250ML POUR (IV SOLUTION) ×1 IMPLANT

## 2023-10-01 NOTE — Anesthesia Preprocedure Evaluation (Signed)
 Anesthesia Evaluation  Patient identified by MRN, date of birth, ID band Patient awake    Reviewed: Allergy & Precautions, H&P , NPO status , Patient's Chart, lab work & pertinent test results, reviewed documented beta blocker date and time   Airway Mallampati: II  TM Distance: >3 FB Neck ROM: full    Dental no notable dental hx. (+) Dental Advisory Given, Teeth Intact   Pulmonary former smoker   Pulmonary exam normal breath sounds clear to auscultation       Cardiovascular Exercise Tolerance: Good negative cardio ROS Normal cardiovascular exam Rhythm:regular Rate:Normal  palpitations   Neuro/Psych negative neurological ROS  negative psych ROS   GI/Hepatic Neg liver ROS,GERD  ,,  Endo/Other  negative endocrine ROS    Renal/GU negative Renal ROS  negative genitourinary   Musculoskeletal  (+) Arthritis , Osteoarthritis,    Abdominal   Peds  Hematology negative hematology ROS (+)   Anesthesia Other Findings   Reproductive/Obstetrics negative OB ROS                              Anesthesia Physical Anesthesia Plan  ASA: 2  Anesthesia Plan: MAC   Post-op Pain Management: Minimal or no pain anticipated   Induction:   PONV Risk Score and Plan: Midazolam   Airway Management Planned: Nasal Cannula and Natural Airway  Additional Equipment: None  Intra-op Plan:   Post-operative Plan:   Informed Consent: I have reviewed the patients History and Physical, chart, labs and discussed the procedure including the risks, benefits and alternatives for the proposed anesthesia with the patient or authorized representative who has indicated his/her understanding and acceptance.     Dental Advisory Given  Plan Discussed with: CRNA  Anesthesia Plan Comments:          Anesthesia Quick Evaluation

## 2023-10-01 NOTE — Discharge Instructions (Signed)
 Please discharge patient when stable, will follow up today with Dr. June Leap at the [facility] office immediately following discharge.  Leave shield in place until visit.  All paperwork with discharge instructions will be given at the office.  [facility] Address:  [address]  [place], [place] [address]

## 2023-10-01 NOTE — Op Note (Signed)
 Date of procedure: 10/01/23  Pre-operative diagnosis: Visually significant age-related nuclear cataract, Right Eye (H25.11)  Post-operative diagnosis: Visually significant age-related nuclear cataract, Right Eye  Procedure: Removal of cataract via phacoemulsification and insertion of intra-ocular lens Vicci and Johnson DIB00 +21.5D into the capsular bag of the Right Eye  Attending surgeon: Lynwood LABOR. Akiel Fennell, MD, MA  Anesthesia: MAC, Topical Akten   Complications: None  Estimated Blood Loss: <81mL (minimal)  Specimens: None  Implants: As above  Indications:  Visually significant age-related cataract, Right Eye  Procedure:  The patient was seen and identified in the pre-operative area. The operative eye was identified and dilated.  The operative eye was marked.  Topical anesthesia was administered to the operative eye.     The patient was then to the operative suite and placed in the supine position.  A timeout was performed confirming the patient, procedure to be performed, and all other relevant information.   The patient's face was prepped and draped in the usual fashion for intra-ocular surgery.  A lid speculum was placed into the operative eye and the surgical microscope moved into place and focused.  A superotemporal paracentesis was created using a 20 gauge paracentesis blade. Omidria  was injected into the anterior chamber. Shugarcaine was injected into the anterior chamber.  Viscoelastic was injected into the anterior chamber.  A temporal clear-corneal main wound incision was created using a 2.16mm microkeratome.  A continuous curvilinear capsulorrhexis was initiated using an irrigating cystitome and completed using capsulorrhexis forceps.  Hydrodissection and hydrodeliniation were performed.  Viscoelastic was injected into the anterior chamber.  A phacoemulsification handpiece and a chopper as a second instrument were used to remove the nucleus and epinucleus. The irrigation/aspiration  handpiece was used to remove any remaining cortical material.   The capsular bag was reinflated with viscoelastic, checked, and found to be intact.  The intraocular lens was inserted into the capsular bag.  The irrigation/aspiration handpiece was used to remove any remaining viscoelastic.  The clear corneal wound and paracentesis wounds were then hydrated and checked with Weck-Cels to be watertight. 0.1mL of moxifloxacin  was injected into the anterior chamber. The lid-speculum and drape was removed, and the patient's face was cleaned with a wet and dry 4x4.  A clear shield was taped over the eye. The patient was taken to the post-operative care unit in good condition, having tolerated the procedure well.  Post-Op Instructions: [facility] post-operative evaluation and will receive all other orders and instructions.

## 2023-10-01 NOTE — Transfer of Care (Signed)
 Immediate Anesthesia Transfer of Care Note  Patient: Joel Snyder  Procedure(s) Performed: PHACOEMULSIFICATION, CATARACT, WITH IOL INSERTION (Right: Eye)  Patient Location: Short Stay  Anesthesia Type:MAC  Level of Consciousness: awake  Airway & Oxygen Therapy: Patient Spontanous Breathing  Post-op Assessment: Report given to RN  Post vital signs: Reviewed and stable  Last Vitals:  Vitals Value Taken Time  BP    Temp    Pulse    Resp    SpO2      Last Pain:  Vitals:   10/01/23 0741  TempSrc: Oral  PainSc: 0-No pain      Patients Stated Pain Goal: 5 (10/01/23 0741)  Complications: No notable events documented.

## 2023-10-01 NOTE — Anesthesia Postprocedure Evaluation (Signed)
 Anesthesia Post Note  Patient: Joel Snyder  Procedure(s) Performed: PHACOEMULSIFICATION, CATARACT, WITH IOL INSERTION (Right: Eye)  Patient location during evaluation: Short Stay Anesthesia Type: MAC Level of consciousness: awake Pain management: pain level controlled Vital Signs Assessment: post-procedure vital signs reviewed and stable Respiratory status: spontaneous breathing Cardiovascular status: blood pressure returned to baseline Postop Assessment: no apparent nausea or vomiting Anesthetic complications: no   No notable events documented.   Last Vitals:  Vitals:   10/01/23 0741 10/01/23 0907  BP: 122/86 (!) 109/91  Pulse: 68 67  Resp: 18 18  Temp: 36.6 C 36.9 C  SpO2: 100% 100%    Last Pain:  Vitals:   10/01/23 0907  TempSrc: Oral  PainSc: 0-No pain                 Kile Kabler

## 2023-10-01 NOTE — Interval H&P Note (Signed)
 History and Physical Interval Note:  10/01/2023 8:41 AM  Joel Snyder  has presented today for surgery, with the diagnosis of nuclear sclerotic cataract, right eye.  The various methods of treatment have been discussed with the patient and family. After consideration of risks, benefits and other options for treatment, the patient has consented to  Procedure(s): PHACOEMULSIFICATION, CATARACT, WITH IOL INSERTION (Right) as a surgical intervention.  The patient's history has been reviewed, patient examined, no change in status, stable for surgery.  I have reviewed the patient's chart and labs.  Questions were answered to the patient's satisfaction.     HARRIE AGENT

## 2023-10-02 ENCOUNTER — Encounter (HOSPITAL_COMMUNITY): Payer: Self-pay | Admitting: Ophthalmology

## 2023-10-08 DIAGNOSIS — Z08 Encounter for follow-up examination after completed treatment for malignant neoplasm: Secondary | ICD-10-CM | POA: Diagnosis not present

## 2023-10-08 DIAGNOSIS — Z85828 Personal history of other malignant neoplasm of skin: Secondary | ICD-10-CM | POA: Diagnosis not present

## 2023-12-10 DIAGNOSIS — H26492 Other secondary cataract, left eye: Secondary | ICD-10-CM | POA: Diagnosis not present
